# Patient Record
Sex: Female | Born: 1940 | Race: White | Hispanic: No | Marital: Married | Smoking: Never smoker
Health system: Southern US, Community
[De-identification: ages and names within clinical notes are randomized; demographics above are authoritative.]

## PROBLEM LIST (undated history)

## (undated) DIAGNOSIS — C50919 Malignant neoplasm of unspecified site of unspecified female breast: Secondary | ICD-10-CM

## (undated) DIAGNOSIS — E237 Disorder of pituitary gland, unspecified: Secondary | ICD-10-CM

## (undated) DIAGNOSIS — R42 Dizziness and giddiness: Secondary | ICD-10-CM

## (undated) DIAGNOSIS — S42023D Displaced fracture of shaft of unspecified clavicle, subsequent encounter for fracture with routine healing: Secondary | ICD-10-CM

## (undated) DIAGNOSIS — R52 Pain, unspecified: Secondary | ICD-10-CM

## (undated) DIAGNOSIS — S129XXA Fracture of neck, unspecified, initial encounter: Secondary | ICD-10-CM

## (undated) DIAGNOSIS — R609 Edema, unspecified: Secondary | ICD-10-CM

## (undated) DIAGNOSIS — Z9889 Other specified postprocedural states: Secondary | ICD-10-CM

## (undated) DIAGNOSIS — T4145XA Adverse effect of unspecified anesthetic, initial encounter: Secondary | ICD-10-CM

## (undated) DIAGNOSIS — I10 Essential (primary) hypertension: Secondary | ICD-10-CM

## (undated) DIAGNOSIS — T8859XA Other complications of anesthesia, initial encounter: Secondary | ICD-10-CM

## (undated) DIAGNOSIS — M1990 Unspecified osteoarthritis, unspecified site: Secondary | ICD-10-CM

## (undated) DIAGNOSIS — N3090 Cystitis, unspecified without hematuria: Secondary | ICD-10-CM

## (undated) DIAGNOSIS — R112 Nausea with vomiting, unspecified: Secondary | ICD-10-CM

## (undated) HISTORY — PX: COLONOSCOPY: SHX174

## (undated) HISTORY — PX: EYE SURGERY: SHX253

## (undated) HISTORY — DX: Cystitis, unspecified without hematuria: N30.90

## (undated) HISTORY — DX: Essential (primary) hypertension: I10

---
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
## 1981-08-19 HISTORY — PX: BREAST EXCISIONAL BIOPSY: SUR124

## 1994-08-19 HISTORY — PX: UPPER GI ENDOSCOPY: SHX6162

## 1994-08-19 HISTORY — PX: CHOLECYSTECTOMY: SHX55

## 2000-08-19 DIAGNOSIS — C50919 Malignant neoplasm of unspecified site of unspecified female breast: Secondary | ICD-10-CM

## 2000-08-19 HISTORY — DX: Malignant neoplasm of unspecified site of unspecified female breast: C50.919

## 2000-08-19 HISTORY — PX: MASTECTOMY: SHX3

## 2000-08-19 HISTORY — PX: BREAST SURGERY: SHX581

## 2004-08-29 ENCOUNTER — Ambulatory Visit: Payer: Self-pay | Admitting: General Surgery

## 2005-02-04 ENCOUNTER — Ambulatory Visit: Payer: Self-pay | Admitting: General Surgery

## 2006-02-17 ENCOUNTER — Ambulatory Visit: Payer: Self-pay | Admitting: General Surgery

## 2007-01-15 ENCOUNTER — Ambulatory Visit: Payer: Self-pay | Admitting: General Surgery

## 2007-01-15 LAB — HM COLONOSCOPY

## 2007-02-03 ENCOUNTER — Ambulatory Visit: Payer: Self-pay | Admitting: General Surgery

## 2007-02-06 ENCOUNTER — Ambulatory Visit: Payer: Self-pay | Admitting: General Surgery

## 2007-02-25 ENCOUNTER — Ambulatory Visit: Payer: Self-pay | Admitting: General Surgery

## 2008-03-03 ENCOUNTER — Ambulatory Visit: Payer: Self-pay | Admitting: General Surgery

## 2008-04-02 ENCOUNTER — Emergency Department: Payer: Self-pay | Admitting: Emergency Medicine

## 2008-04-06 ENCOUNTER — Emergency Department: Payer: Self-pay | Admitting: Emergency Medicine

## 2009-03-07 ENCOUNTER — Ambulatory Visit: Payer: Self-pay | Admitting: General Surgery

## 2009-10-17 ENCOUNTER — Emergency Department: Payer: Self-pay | Admitting: Emergency Medicine

## 2009-10-17 DIAGNOSIS — X58XXXA Exposure to other specified factors, initial encounter: Secondary | ICD-10-CM | POA: Insufficient documentation

## 2009-12-20 DIAGNOSIS — E559 Vitamin D deficiency, unspecified: Secondary | ICD-10-CM | POA: Insufficient documentation

## 2010-02-20 ENCOUNTER — Ambulatory Visit: Payer: Self-pay | Admitting: Unknown Physician Specialty

## 2010-03-08 ENCOUNTER — Ambulatory Visit: Payer: Self-pay | Admitting: Unknown Physician Specialty

## 2011-03-13 ENCOUNTER — Ambulatory Visit: Payer: Self-pay | Admitting: Unknown Physician Specialty

## 2012-04-06 ENCOUNTER — Ambulatory Visit: Payer: Self-pay | Admitting: Unknown Physician Specialty

## 2013-03-04 ENCOUNTER — Encounter: Payer: Self-pay | Admitting: *Deleted

## 2013-03-04 DIAGNOSIS — C801 Malignant (primary) neoplasm, unspecified: Secondary | ICD-10-CM | POA: Insufficient documentation

## 2013-05-13 ENCOUNTER — Ambulatory Visit: Payer: Self-pay | Admitting: Unknown Physician Specialty

## 2014-06-06 LAB — LIPID PANEL
Cholesterol: 218 mg/dL — AB (ref 0–200)
HDL: 46 mg/dL (ref 35–70)
LDL Cholesterol: 142 mg/dL
Triglycerides: 149 mg/dL (ref 40–160)

## 2014-10-20 ENCOUNTER — Ambulatory Visit: Payer: Self-pay | Admitting: Unknown Physician Specialty

## 2014-11-24 LAB — CBC AND DIFFERENTIAL
NEUTROS ABS: 7 /uL
WBC: 8.9 10*3/mL

## 2014-11-24 LAB — TSH: TSH: 4.53 u[IU]/mL (ref 0.41–5.90)

## 2014-11-24 LAB — BASIC METABOLIC PANEL
BUN: 7 mg/dL (ref 4–21)
CREATININE: 0.7 mg/dL (ref 0.5–1.1)
Glucose: 101 mg/dL
Sodium: 135 mmol/L — AB (ref 137–147)

## 2014-12-22 DIAGNOSIS — I10 Essential (primary) hypertension: Secondary | ICD-10-CM | POA: Insufficient documentation

## 2015-02-03 ENCOUNTER — Other Ambulatory Visit: Payer: Self-pay | Admitting: Physician Assistant

## 2015-02-03 DIAGNOSIS — I10 Essential (primary) hypertension: Secondary | ICD-10-CM

## 2015-02-03 MED ORDER — AMLODIPINE BESYLATE 5 MG PO TABS: 5.0000 mg | ORAL_TABLET | Freq: Every day | ORAL | Status: DC

## 2015-02-03 NOTE — Telephone Encounter (Signed)
[facility]  Thanks! -JB

## 2015-02-03 NOTE — Telephone Encounter (Signed)
Pt would like Amlodipine 5 mg sent to [facility] [address] Abbott Pao started this medication in April 2016 when she saw [place]. [facility] stated [facility] and she only has 2 pills left. Thanks TNP

## 2015-02-03 NOTE — Telephone Encounter (Signed)
Patient advised RX has been sent to pharmacy  

## 2015-03-03 DIAGNOSIS — H2513 Age-related nuclear cataract, bilateral: Secondary | ICD-10-CM | POA: Diagnosis not present

## 2015-04-08 DIAGNOSIS — L298 Other pruritus: Secondary | ICD-10-CM | POA: Diagnosis not present

## 2015-04-12 DIAGNOSIS — L508 Other urticaria: Secondary | ICD-10-CM | POA: Diagnosis not present

## 2015-04-13 DIAGNOSIS — L508 Other urticaria: Secondary | ICD-10-CM | POA: Diagnosis not present

## 2015-04-17 ENCOUNTER — Other Ambulatory Visit: Payer: Self-pay

## 2015-04-17 ENCOUNTER — Emergency Department
Admission: EM | Admit: 2015-04-17 | Discharge: 2015-04-17 | Disposition: A | Discharge status: Other Acute Inpatient Hospital | Payer: Medicare PPO | Attending: Emergency Medicine | Admitting: Emergency Medicine

## 2015-04-17 ENCOUNTER — Emergency Department: Payer: Medicare PPO

## 2015-04-17 DIAGNOSIS — S12500A Unspecified displaced fracture of sixth cervical vertebra, initial encounter for closed fracture: Secondary | ICD-10-CM | POA: Diagnosis not present

## 2015-04-17 DIAGNOSIS — S12101A Unspecified nondisplaced fracture of second cervical vertebra, initial encounter for closed fracture: Secondary | ICD-10-CM | POA: Diagnosis not present

## 2015-04-17 DIAGNOSIS — S3993XA Unspecified injury of pelvis, initial encounter: Secondary | ICD-10-CM | POA: Diagnosis not present

## 2015-04-17 DIAGNOSIS — S2220XA Unspecified fracture of sternum, initial encounter for closed fracture: Secondary | ICD-10-CM | POA: Diagnosis not present

## 2015-04-17 DIAGNOSIS — I5189 Other ill-defined heart diseases: Secondary | ICD-10-CM | POA: Diagnosis not present

## 2015-04-17 DIAGNOSIS — R079 Chest pain, unspecified: Secondary | ICD-10-CM | POA: Diagnosis not present

## 2015-04-17 DIAGNOSIS — Z041 Encounter for examination and observation following transport accident: Secondary | ICD-10-CM | POA: Diagnosis not present

## 2015-04-17 DIAGNOSIS — Y998 Other external cause status: Secondary | ICD-10-CM | POA: Diagnosis not present

## 2015-04-17 DIAGNOSIS — M2578 Osteophyte, vertebrae: Secondary | ICD-10-CM | POA: Diagnosis not present

## 2015-04-17 DIAGNOSIS — S2241XA Multiple fractures of ribs, right side, initial encounter for closed fracture: Secondary | ICD-10-CM | POA: Insufficient documentation

## 2015-04-17 DIAGNOSIS — S0990XA Unspecified injury of head, initial encounter: Secondary | ICD-10-CM | POA: Insufficient documentation

## 2015-04-17 DIAGNOSIS — S27892A Contusion of other specified intrathoracic organs, initial encounter: Secondary | ICD-10-CM | POA: Diagnosis not present

## 2015-04-17 DIAGNOSIS — M19011 Primary osteoarthritis, right shoulder: Secondary | ICD-10-CM | POA: Diagnosis not present

## 2015-04-17 DIAGNOSIS — S2231XA Fracture of one rib, right side, initial encounter for closed fracture: Secondary | ICD-10-CM

## 2015-04-17 DIAGNOSIS — I10 Essential (primary) hypertension: Secondary | ICD-10-CM | POA: Diagnosis not present

## 2015-04-17 DIAGNOSIS — S12100A Unspecified displaced fracture of second cervical vertebra, initial encounter for closed fracture: Secondary | ICD-10-CM | POA: Insufficient documentation

## 2015-04-17 DIAGNOSIS — Y9389 Activity, other specified: Secondary | ICD-10-CM | POA: Insufficient documentation

## 2015-04-17 DIAGNOSIS — M25519 Pain in unspecified shoulder: Secondary | ICD-10-CM | POA: Diagnosis not present

## 2015-04-17 DIAGNOSIS — M5032 Other cervical disc degeneration, mid-cervical region: Secondary | ICD-10-CM | POA: Diagnosis not present

## 2015-04-17 DIAGNOSIS — I351 Nonrheumatic aortic (valve) insufficiency: Secondary | ICD-10-CM | POA: Diagnosis not present

## 2015-04-17 DIAGNOSIS — J9811 Atelectasis: Secondary | ICD-10-CM | POA: Diagnosis not present

## 2015-04-17 DIAGNOSIS — S42001A Fracture of unspecified part of right clavicle, initial encounter for closed fracture: Secondary | ICD-10-CM | POA: Diagnosis not present

## 2015-04-17 DIAGNOSIS — S2249XA Multiple fractures of ribs, unspecified side, initial encounter for closed fracture: Secondary | ICD-10-CM | POA: Diagnosis not present

## 2015-04-17 DIAGNOSIS — M5136 Other intervertebral disc degeneration, lumbar region: Secondary | ICD-10-CM | POA: Diagnosis not present

## 2015-04-17 DIAGNOSIS — S129XXA Fracture of neck, unspecified, initial encounter: Secondary | ICD-10-CM | POA: Diagnosis not present

## 2015-04-17 DIAGNOSIS — S12190A Other displaced fracture of second cervical vertebra, initial encounter for closed fracture: Secondary | ICD-10-CM | POA: Diagnosis not present

## 2015-04-17 DIAGNOSIS — M47816 Spondylosis without myelopathy or radiculopathy, lumbar region: Secondary | ICD-10-CM | POA: Diagnosis not present

## 2015-04-17 DIAGNOSIS — I4949 Other premature depolarization: Secondary | ICD-10-CM | POA: Diagnosis not present

## 2015-04-17 DIAGNOSIS — S3981XA Other specified injuries of abdomen, initial encounter: Secondary | ICD-10-CM | POA: Diagnosis not present

## 2015-04-17 DIAGNOSIS — S42021A Displaced fracture of shaft of right clavicle, initial encounter for closed fracture: Secondary | ICD-10-CM | POA: Diagnosis not present

## 2015-04-17 DIAGNOSIS — S3992XA Unspecified injury of lower back, initial encounter: Secondary | ICD-10-CM | POA: Diagnosis not present

## 2015-04-17 DIAGNOSIS — S2222XA Fracture of body of sternum, initial encounter for closed fracture: Secondary | ICD-10-CM | POA: Diagnosis not present

## 2015-04-17 DIAGNOSIS — G8911 Acute pain due to trauma: Secondary | ICD-10-CM | POA: Diagnosis not present

## 2015-04-17 DIAGNOSIS — S20211A Contusion of right front wall of thorax, initial encounter: Secondary | ICD-10-CM | POA: Insufficient documentation

## 2015-04-17 DIAGNOSIS — M4856XA Collapsed vertebra, not elsewhere classified, lumbar region, initial encounter for fracture: Secondary | ICD-10-CM | POA: Diagnosis not present

## 2015-04-17 DIAGNOSIS — J45909 Unspecified asthma, uncomplicated: Secondary | ICD-10-CM | POA: Diagnosis not present

## 2015-04-17 DIAGNOSIS — Y92410 Unspecified street and highway as the place of occurrence of the external cause: Secondary | ICD-10-CM | POA: Diagnosis not present

## 2015-04-17 DIAGNOSIS — S199XXA Unspecified injury of neck, initial encounter: Secondary | ICD-10-CM | POA: Diagnosis present

## 2015-04-17 LAB — CBC WITH DIFFERENTIAL/PLATELET
Basophils Absolute: 0.2 10*3/uL — ABNORMAL HIGH (ref 0–0.1)
Basophils Relative: 1 %
EOS PCT: 1 %
Eosinophils Absolute: 0.2 10*3/uL (ref 0–0.7)
HEMATOCRIT: 46.6 % (ref 35.0–47.0)
Hemoglobin: 15.7 g/dL (ref 12.0–16.0)
LYMPHS ABS: 3.1 10*3/uL (ref 1.0–3.6)
LYMPHS PCT: 19 %
MCH: 28.4 pg (ref 26.0–34.0)
MCHC: 33.6 g/dL (ref 32.0–36.0)
MCV: 84.4 fL (ref 80.0–100.0)
MONO ABS: 1 10*3/uL — AB (ref 0.2–0.9)
Monocytes Relative: 6 %
NEUTROS ABS: 11.6 10*3/uL — AB (ref 1.4–6.5)
Neutrophils Relative %: 73 %
PLATELETS: 358 10*3/uL (ref 150–440)
RBC: 5.52 MIL/uL — AB (ref 3.80–5.20)
RDW: 12.8 % (ref 11.5–14.5)
WBC: 16 10*3/uL — ABNORMAL HIGH (ref 3.6–11.0)

## 2015-04-17 LAB — COMPREHENSIVE METABOLIC PANEL
ALT: 16 U/L (ref 14–54)
AST: 40 U/L (ref 15–41)
Albumin: 4.7 g/dL (ref 3.5–5.0)
Alkaline Phosphatase: 62 U/L (ref 38–126)
Anion gap: 10 (ref 5–15)
BILIRUBIN TOTAL: 0.7 mg/dL (ref 0.3–1.2)
BUN: 8 mg/dL (ref 6–20)
CALCIUM: 9.7 mg/dL (ref 8.9–10.3)
CHLORIDE: 98 mmol/L — AB (ref 101–111)
CO2: 25 mmol/L (ref 22–32)
CREATININE: 0.75 mg/dL (ref 0.44–1.00)
Glucose, Bld: 123 mg/dL — ABNORMAL HIGH (ref 65–99)
Potassium: 3.1 mmol/L — ABNORMAL LOW (ref 3.5–5.1)
Sodium: 133 mmol/L — ABNORMAL LOW (ref 135–145)
TOTAL PROTEIN: 7.6 g/dL (ref 6.5–8.1)

## 2015-04-17 LAB — URINALYSIS COMPLETE WITH MICROSCOPIC (ARMC ONLY)
BILIRUBIN URINE: NEGATIVE
Bacteria, UA: NONE SEEN
GLUCOSE, UA: NEGATIVE mg/dL
Hgb urine dipstick: NEGATIVE
Leukocytes, UA: NEGATIVE
NITRITE: NEGATIVE
Protein, ur: NEGATIVE mg/dL
Specific Gravity, Urine: 1.009 (ref 1.005–1.030)
pH: 7 (ref 5.0–8.0)

## 2015-04-17 MED ORDER — ONDANSETRON HCL 4 MG/2ML IJ SOLN
4.0000 mg | Freq: Once | INTRAMUSCULAR | Status: AC
  Administered 2015-04-17: 4 mg via INTRAVENOUS
  Filled 2015-04-17: qty 2

## 2015-04-17 MED ORDER — MORPHINE SULFATE (PF) 4 MG/ML IV SOLN
4.0000 mg | Freq: Once | INTRAVENOUS | Status: AC
  Administered 2015-04-17: 4 mg via INTRAVENOUS
  Filled 2015-04-17: qty 1

## 2015-04-17 MED ORDER — HYDROMORPHONE HCL 1 MG/ML IJ SOLN
0.5000 mg | Freq: Once | INTRAMUSCULAR | Status: AC
  Administered 2015-04-17: 0.5 mg via INTRAVENOUS
  Filled 2015-04-17: qty 1
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
## 2015-04-17 NOTE — ED Notes (Signed)
Patient able to void using bedpan. Urine sent to lab. Patient to CT.
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
## 2015-04-21 DIAGNOSIS — Z9181 History of falling: Secondary | ICD-10-CM | POA: Diagnosis not present

## 2015-04-21 DIAGNOSIS — Z853 Personal history of malignant neoplasm of breast: Secondary | ICD-10-CM | POA: Diagnosis not present

## 2015-04-21 DIAGNOSIS — I10 Essential (primary) hypertension: Secondary | ICD-10-CM | POA: Diagnosis not present

## 2015-04-21 DIAGNOSIS — J45909 Unspecified asthma, uncomplicated: Secondary | ICD-10-CM | POA: Diagnosis not present

## 2015-04-21 DIAGNOSIS — S2241XD Multiple fractures of ribs, right side, subsequent encounter for fracture with routine healing: Secondary | ICD-10-CM | POA: Diagnosis not present

## 2015-04-21 DIAGNOSIS — S2220XD Unspecified fracture of sternum, subsequent encounter for fracture with routine healing: Secondary | ICD-10-CM | POA: Diagnosis not present

## 2015-04-21 DIAGNOSIS — S12100D Unspecified displaced fracture of second cervical vertebra, subsequent encounter for fracture with routine healing: Secondary | ICD-10-CM | POA: Diagnosis not present

## 2015-04-25 DIAGNOSIS — Z853 Personal history of malignant neoplasm of breast: Secondary | ICD-10-CM | POA: Diagnosis not present

## 2015-04-25 DIAGNOSIS — S12100D Unspecified displaced fracture of second cervical vertebra, subsequent encounter for fracture with routine healing: Secondary | ICD-10-CM | POA: Diagnosis not present

## 2015-04-25 DIAGNOSIS — S2220XD Unspecified fracture of sternum, subsequent encounter for fracture with routine healing: Secondary | ICD-10-CM | POA: Diagnosis not present

## 2015-04-25 DIAGNOSIS — J45909 Unspecified asthma, uncomplicated: Secondary | ICD-10-CM | POA: Diagnosis not present

## 2015-04-25 DIAGNOSIS — S2241XD Multiple fractures of ribs, right side, subsequent encounter for fracture with routine healing: Secondary | ICD-10-CM | POA: Diagnosis not present

## 2015-04-25 DIAGNOSIS — Z9181 History of falling: Secondary | ICD-10-CM | POA: Diagnosis not present

## 2015-04-25 DIAGNOSIS — I10 Essential (primary) hypertension: Secondary | ICD-10-CM | POA: Diagnosis not present

## 2015-04-26 DIAGNOSIS — S2220XD Unspecified fracture of sternum, subsequent encounter for fracture with routine healing: Secondary | ICD-10-CM | POA: Diagnosis not present

## 2015-04-26 DIAGNOSIS — I10 Essential (primary) hypertension: Secondary | ICD-10-CM | POA: Diagnosis not present

## 2015-04-26 DIAGNOSIS — J45909 Unspecified asthma, uncomplicated: Secondary | ICD-10-CM | POA: Diagnosis not present

## 2015-04-26 DIAGNOSIS — S12100D Unspecified displaced fracture of second cervical vertebra, subsequent encounter for fracture with routine healing: Secondary | ICD-10-CM | POA: Diagnosis not present

## 2015-04-26 DIAGNOSIS — Z9181 History of falling: Secondary | ICD-10-CM | POA: Diagnosis not present

## 2015-04-26 DIAGNOSIS — Z853 Personal history of malignant neoplasm of breast: Secondary | ICD-10-CM | POA: Diagnosis not present

## 2015-04-26 DIAGNOSIS — S2241XD Multiple fractures of ribs, right side, subsequent encounter for fracture with routine healing: Secondary | ICD-10-CM | POA: Diagnosis not present

## 2015-04-27 DIAGNOSIS — S2241XD Multiple fractures of ribs, right side, subsequent encounter for fracture with routine healing: Secondary | ICD-10-CM | POA: Diagnosis not present

## 2015-04-27 DIAGNOSIS — S2220XD Unspecified fracture of sternum, subsequent encounter for fracture with routine healing: Secondary | ICD-10-CM | POA: Diagnosis not present

## 2015-04-27 DIAGNOSIS — S12100D Unspecified displaced fracture of second cervical vertebra, subsequent encounter for fracture with routine healing: Secondary | ICD-10-CM | POA: Diagnosis not present

## 2015-04-27 DIAGNOSIS — J45909 Unspecified asthma, uncomplicated: Secondary | ICD-10-CM | POA: Diagnosis not present

## 2015-04-27 DIAGNOSIS — I10 Essential (primary) hypertension: Secondary | ICD-10-CM | POA: Diagnosis not present

## 2015-04-27 DIAGNOSIS — Z9181 History of falling: Secondary | ICD-10-CM | POA: Diagnosis not present

## 2015-04-27 DIAGNOSIS — Z853 Personal history of malignant neoplasm of breast: Secondary | ICD-10-CM | POA: Diagnosis not present

## 2015-05-02 DIAGNOSIS — Z9181 History of falling: Secondary | ICD-10-CM | POA: Diagnosis not present

## 2015-05-02 DIAGNOSIS — J45909 Unspecified asthma, uncomplicated: Secondary | ICD-10-CM | POA: Diagnosis not present

## 2015-05-02 DIAGNOSIS — Z853 Personal history of malignant neoplasm of breast: Secondary | ICD-10-CM | POA: Diagnosis not present

## 2015-05-02 DIAGNOSIS — S12100D Unspecified displaced fracture of second cervical vertebra, subsequent encounter for fracture with routine healing: Secondary | ICD-10-CM | POA: Diagnosis not present

## 2015-05-02 DIAGNOSIS — S2220XD Unspecified fracture of sternum, subsequent encounter for fracture with routine healing: Secondary | ICD-10-CM | POA: Diagnosis not present

## 2015-05-02 DIAGNOSIS — S2241XD Multiple fractures of ribs, right side, subsequent encounter for fracture with routine healing: Secondary | ICD-10-CM | POA: Diagnosis not present

## 2015-05-02 DIAGNOSIS — I10 Essential (primary) hypertension: Secondary | ICD-10-CM | POA: Diagnosis not present

## 2015-05-04 DIAGNOSIS — S42021D Displaced fracture of shaft of right clavicle, subsequent encounter for fracture with routine healing: Secondary | ICD-10-CM | POA: Diagnosis not present

## 2015-05-04 DIAGNOSIS — M898X1 Other specified disorders of bone, shoulder: Secondary | ICD-10-CM | POA: Diagnosis not present

## 2015-05-04 DIAGNOSIS — S43004A Unspecified dislocation of right shoulder joint, initial encounter: Secondary | ICD-10-CM | POA: Diagnosis not present

## 2015-05-05 DIAGNOSIS — Z853 Personal history of malignant neoplasm of breast: Secondary | ICD-10-CM | POA: Diagnosis not present

## 2015-05-05 DIAGNOSIS — S2241XD Multiple fractures of ribs, right side, subsequent encounter for fracture with routine healing: Secondary | ICD-10-CM | POA: Diagnosis not present

## 2015-05-05 DIAGNOSIS — I10 Essential (primary) hypertension: Secondary | ICD-10-CM | POA: Diagnosis not present

## 2015-05-05 DIAGNOSIS — Z9181 History of falling: Secondary | ICD-10-CM | POA: Diagnosis not present

## 2015-05-05 DIAGNOSIS — J45909 Unspecified asthma, uncomplicated: Secondary | ICD-10-CM | POA: Diagnosis not present

## 2015-05-05 DIAGNOSIS — S2220XD Unspecified fracture of sternum, subsequent encounter for fracture with routine healing: Secondary | ICD-10-CM | POA: Diagnosis not present

## 2015-05-05 DIAGNOSIS — S12100D Unspecified displaced fracture of second cervical vertebra, subsequent encounter for fracture with routine healing: Secondary | ICD-10-CM | POA: Diagnosis not present

## 2015-05-09 ENCOUNTER — Ambulatory Visit (INDEPENDENT_AMBULATORY_CARE_PROVIDER_SITE_OTHER): Payer: Medicare PPO | Admitting: Family Medicine

## 2015-05-09 ENCOUNTER — Encounter: Payer: Self-pay | Admitting: Family Medicine

## 2015-05-09 VITALS — BP 130/70 | HR 80 | Temp 98.7°F | Resp 16 | Wt 145.0 lb

## 2015-05-09 DIAGNOSIS — G4700 Insomnia, unspecified: Secondary | ICD-10-CM | POA: Diagnosis not present

## 2015-05-09 DIAGNOSIS — S129XXA Fracture of neck, unspecified, initial encounter: Secondary | ICD-10-CM

## 2015-05-09 DIAGNOSIS — S42009A Fracture of unspecified part of unspecified clavicle, initial encounter for closed fracture: Secondary | ICD-10-CM | POA: Diagnosis not present

## 2015-05-09 HISTORY — DX: Fracture of neck, unspecified, initial encounter: S12.9XXA

## 2015-05-09 MED ORDER — ALPRAZOLAM 0.25 MG PO TABS: 0.2500 mg | ORAL_TABLET | Freq: Four times a day (QID) | ORAL | Status: DC | PRN
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
## 2015-05-11 DIAGNOSIS — J45909 Unspecified asthma, uncomplicated: Secondary | ICD-10-CM | POA: Diagnosis not present

## 2015-05-11 DIAGNOSIS — S2241XD Multiple fractures of ribs, right side, subsequent encounter for fracture with routine healing: Secondary | ICD-10-CM | POA: Diagnosis not present

## 2015-05-11 DIAGNOSIS — S12100D Unspecified displaced fracture of second cervical vertebra, subsequent encounter for fracture with routine healing: Secondary | ICD-10-CM | POA: Diagnosis not present

## 2015-05-11 DIAGNOSIS — I10 Essential (primary) hypertension: Secondary | ICD-10-CM | POA: Diagnosis not present

## 2015-05-11 DIAGNOSIS — Z9181 History of falling: Secondary | ICD-10-CM | POA: Diagnosis not present

## 2015-05-11 DIAGNOSIS — S2220XD Unspecified fracture of sternum, subsequent encounter for fracture with routine healing: Secondary | ICD-10-CM | POA: Diagnosis not present

## 2015-05-11 DIAGNOSIS — Z853 Personal history of malignant neoplasm of breast: Secondary | ICD-10-CM | POA: Diagnosis not present

## 2015-05-13 ENCOUNTER — Emergency Department: Payer: Medicare PPO

## 2015-05-13 ENCOUNTER — Emergency Department
Admission: EM | Admit: 2015-05-13 | Discharge: 2015-05-13 | Disposition: A | Discharge status: Home / Self Care | Payer: Medicare PPO | Attending: Student | Admitting: Student

## 2015-05-13 DIAGNOSIS — M542 Cervicalgia: Secondary | ICD-10-CM | POA: Insufficient documentation

## 2015-05-13 DIAGNOSIS — R42 Dizziness and giddiness: Secondary | ICD-10-CM | POA: Diagnosis not present

## 2015-05-13 DIAGNOSIS — Z79899 Other long term (current) drug therapy: Secondary | ICD-10-CM | POA: Diagnosis not present

## 2015-05-13 DIAGNOSIS — R079 Chest pain, unspecified: Secondary | ICD-10-CM | POA: Insufficient documentation

## 2015-05-13 DIAGNOSIS — I10 Essential (primary) hypertension: Secondary | ICD-10-CM | POA: Diagnosis not present

## 2015-05-13 DIAGNOSIS — G8921 Chronic pain due to trauma: Secondary | ICD-10-CM | POA: Diagnosis not present

## 2015-05-13 DIAGNOSIS — Z7952 Long term (current) use of systemic steroids: Secondary | ICD-10-CM | POA: Insufficient documentation

## 2015-05-13 DIAGNOSIS — M549 Dorsalgia, unspecified: Secondary | ICD-10-CM | POA: Insufficient documentation

## 2015-05-13 DIAGNOSIS — H8110 Benign paroxysmal vertigo, unspecified ear: Secondary | ICD-10-CM | POA: Diagnosis not present

## 2015-05-13 DIAGNOSIS — R531 Weakness: Secondary | ICD-10-CM | POA: Diagnosis not present

## 2015-05-13 LAB — URINALYSIS COMPLETE WITH MICROSCOPIC (ARMC ONLY)
Bilirubin Urine: NEGATIVE
Glucose, UA: NEGATIVE mg/dL
Hgb urine dipstick: NEGATIVE
Ketones, ur: NEGATIVE mg/dL
Leukocytes, UA: NEGATIVE
Nitrite: NEGATIVE
PH: 7 (ref 5.0–8.0)
PROTEIN: NEGATIVE mg/dL
Specific Gravity, Urine: 1.005 (ref 1.005–1.030)

## 2015-05-13 LAB — CBC WITH DIFFERENTIAL/PLATELET
BASOS ABS: 0.1 10*3/uL (ref 0–0.1)
Basophils Relative: 1 %
EOS PCT: 1 %
Eosinophils Absolute: 0.1 10*3/uL (ref 0–0.7)
HEMATOCRIT: 42 % (ref 35.0–47.0)
HEMOGLOBIN: 14.7 g/dL (ref 12.0–16.0)
LYMPHS ABS: 0.9 10*3/uL — AB (ref 1.0–3.6)
LYMPHS PCT: 11 %
MCH: 29.3 pg (ref 26.0–34.0)
MCHC: 35.1 g/dL (ref 32.0–36.0)
MCV: 83.5 fL (ref 80.0–100.0)
Monocytes Absolute: 0.7 10*3/uL (ref 0.2–0.9)
Monocytes Relative: 9 %
Neutro Abs: 6.6 10*3/uL — ABNORMAL HIGH (ref 1.4–6.5)
Neutrophils Relative %: 78 %
PLATELETS: 319 10*3/uL (ref 150–440)
RBC: 5.03 MIL/uL (ref 3.80–5.20)
RDW: 12.5 % (ref 11.5–14.5)
WBC: 8.4 10*3/uL (ref 3.6–11.0)

## 2015-05-13 LAB — COMPREHENSIVE METABOLIC PANEL
ALK PHOS: 166 U/L — AB (ref 38–126)
ALT: 14 U/L (ref 14–54)
AST: 27 U/L (ref 15–41)
Albumin: 4.5 g/dL (ref 3.5–5.0)
Anion gap: 11 (ref 5–15)
BUN: 8 mg/dL (ref 6–20)
CALCIUM: 9.5 mg/dL (ref 8.9–10.3)
CHLORIDE: 94 mmol/L — AB (ref 101–111)
CO2: 25 mmol/L (ref 22–32)
CREATININE: 0.57 mg/dL (ref 0.44–1.00)
GFR calc Af Amer: >60 mL/min (ref >60)
Glucose, Bld: 110 mg/dL — ABNORMAL HIGH (ref 65–99)
Potassium: 3.6 mmol/L (ref 3.5–5.1)
Sodium: 130 mmol/L — ABNORMAL LOW (ref 135–145)
Total Bilirubin: 0.3 mg/dL (ref 0.3–1.2)
Total Protein: 7.3 g/dL (ref 6.5–8.1)

## 2015-05-13 LAB — TROPONIN I

## 2015-05-13 MED ORDER — SODIUM CHLORIDE 0.9 % IV BOLUS (SEPSIS)
1000.0000 mL | Freq: Once | INTRAVENOUS | Status: AC
  Administered 2015-05-13: 1000 mL via INTRAVENOUS
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
## 2015-05-13 NOTE — ED Notes (Signed)
Iv and urine cather from august d/c'd from computer
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
## 2015-05-19 DIAGNOSIS — M5021 Other cervical disc displacement,  high cervical region: Secondary | ICD-10-CM | POA: Diagnosis not present

## 2015-05-19 DIAGNOSIS — Z6822 Body mass index (BMI) 22.0-22.9, adult: Secondary | ICD-10-CM | POA: Diagnosis not present

## 2015-05-19 DIAGNOSIS — S12101A Unspecified nondisplaced fracture of second cervical vertebra, initial encounter for closed fracture: Secondary | ICD-10-CM | POA: Diagnosis not present

## 2015-05-19 DIAGNOSIS — Z888 Allergy status to other drugs, medicaments and biological substances status: Secondary | ICD-10-CM | POA: Diagnosis not present

## 2015-05-19 DIAGNOSIS — Z885 Allergy status to narcotic agent status: Secondary | ICD-10-CM | POA: Diagnosis not present

## 2015-05-19 DIAGNOSIS — S12100D Unspecified displaced fracture of second cervical vertebra, subsequent encounter for fracture with routine healing: Secondary | ICD-10-CM | POA: Diagnosis not present

## 2015-05-19 DIAGNOSIS — Z91018 Allergy to other foods: Secondary | ICD-10-CM | POA: Diagnosis not present

## 2015-05-19 DIAGNOSIS — M542 Cervicalgia: Secondary | ICD-10-CM | POA: Diagnosis not present

## 2015-05-19 DIAGNOSIS — S32039D Unspecified fracture of third lumbar vertebra, subsequent encounter for fracture with routine healing: Secondary | ICD-10-CM | POA: Diagnosis not present

## 2015-05-19 DIAGNOSIS — S42001A Fracture of unspecified part of right clavicle, initial encounter for closed fracture: Secondary | ICD-10-CM | POA: Diagnosis not present

## 2015-05-19 DIAGNOSIS — Z882 Allergy status to sulfonamides status: Secondary | ICD-10-CM | POA: Diagnosis not present

## 2015-06-01 DIAGNOSIS — S42023D Displaced fracture of shaft of unspecified clavicle, subsequent encounter for fracture with routine healing: Secondary | ICD-10-CM | POA: Insufficient documentation

## 2015-06-01 DIAGNOSIS — M898X1 Other specified disorders of bone, shoulder: Secondary | ICD-10-CM | POA: Diagnosis not present

## 2015-06-01 DIAGNOSIS — S42021D Displaced fracture of shaft of right clavicle, subsequent encounter for fracture with routine healing: Secondary | ICD-10-CM | POA: Diagnosis not present

## 2015-06-01 HISTORY — DX: Displaced fracture of shaft of unspecified clavicle, subsequent encounter for fracture with routine healing: S42.023D

## 2015-06-07 ENCOUNTER — Other Ambulatory Visit: Payer: Self-pay | Admitting: Family Medicine

## 2015-06-09 ENCOUNTER — Other Ambulatory Visit: Payer: Self-pay | Admitting: Physician Assistant

## 2015-06-09 MED ORDER — METOPROLOL SUCCINATE ER 100 MG PO TB24: 100.0000 mg | ORAL_TABLET | Freq: Every day | ORAL | Status: DC

## 2015-06-12 ENCOUNTER — Other Ambulatory Visit: Payer: Self-pay | Admitting: Family Medicine

## 2015-06-12 NOTE — Telephone Encounter (Signed)
Pt called saying the pharmacy was trying to get [place] for a refill on her metoprolol succinate (TOPROL-XL) 100 MG 24 hr tablet but has been unable.   [facility] [address]  Hoyt Koch

## 2015-06-13 NOTE — Telephone Encounter (Signed)
Rx has already been sent

## 2015-06-20 DIAGNOSIS — S12100A Unspecified displaced fracture of second cervical vertebra, initial encounter for closed fracture: Secondary | ICD-10-CM | POA: Diagnosis not present

## 2015-06-20 DIAGNOSIS — S12100D Unspecified displaced fracture of second cervical vertebra, subsequent encounter for fracture with routine healing: Secondary | ICD-10-CM | POA: Diagnosis not present

## 2015-06-20 DIAGNOSIS — Z6822 Body mass index (BMI) 22.0-22.9, adult: Secondary | ICD-10-CM | POA: Diagnosis not present

## 2015-06-20 DIAGNOSIS — M4310 Spondylolisthesis, site unspecified: Secondary | ICD-10-CM | POA: Diagnosis not present

## 2015-06-20 DIAGNOSIS — M50322 Other cervical disc degeneration at C5-C6 level: Secondary | ICD-10-CM | POA: Diagnosis not present

## 2015-06-20 DIAGNOSIS — M5010 Cervical disc disorder with radiculopathy, unspecified cervical region: Secondary | ICD-10-CM | POA: Diagnosis not present

## 2015-06-20 DIAGNOSIS — M50323 Other cervical disc degeneration at C6-C7 level: Secondary | ICD-10-CM | POA: Diagnosis not present

## 2015-07-01 ENCOUNTER — Ambulatory Visit (INDEPENDENT_AMBULATORY_CARE_PROVIDER_SITE_OTHER): Payer: Medicare PPO

## 2015-07-01 DIAGNOSIS — Z23 Encounter for immunization: Secondary | ICD-10-CM

## 2015-07-10 DIAGNOSIS — S42001A Fracture of unspecified part of right clavicle, initial encounter for closed fracture: Secondary | ICD-10-CM | POA: Diagnosis not present

## 2015-07-10 DIAGNOSIS — S42001D Fracture of unspecified part of right clavicle, subsequent encounter for fracture with routine healing: Secondary | ICD-10-CM | POA: Diagnosis not present

## 2015-07-10 DIAGNOSIS — T148 Other injury of unspecified body region: Secondary | ICD-10-CM | POA: Diagnosis not present

## 2015-07-10 DIAGNOSIS — S42021D Displaced fracture of shaft of right clavicle, subsequent encounter for fracture with routine healing: Secondary | ICD-10-CM | POA: Diagnosis not present

## 2015-07-19 DIAGNOSIS — M5137 Other intervertebral disc degeneration, lumbosacral region: Secondary | ICD-10-CM | POA: Diagnosis not present

## 2015-07-19 DIAGNOSIS — Z888 Allergy status to other drugs, medicaments and biological substances status: Secondary | ICD-10-CM | POA: Diagnosis not present

## 2015-07-19 DIAGNOSIS — S22059D Unspecified fracture of T5-T6 vertebra, subsequent encounter for fracture with routine healing: Secondary | ICD-10-CM | POA: Diagnosis not present

## 2015-07-19 DIAGNOSIS — S22000S Wedge compression fracture of unspecified thoracic vertebra, sequela: Secondary | ICD-10-CM | POA: Diagnosis not present

## 2015-07-19 DIAGNOSIS — M4854XD Collapsed vertebra, not elsewhere classified, thoracic region, subsequent encounter for fracture with routine healing: Secondary | ICD-10-CM | POA: Diagnosis not present

## 2015-07-19 DIAGNOSIS — Z91041 Radiographic dye allergy status: Secondary | ICD-10-CM | POA: Diagnosis not present

## 2015-07-19 DIAGNOSIS — Z882 Allergy status to sulfonamides status: Secondary | ICD-10-CM | POA: Diagnosis not present

## 2015-07-19 DIAGNOSIS — S42001D Fracture of unspecified part of right clavicle, subsequent encounter for fracture with routine healing: Secondary | ICD-10-CM | POA: Diagnosis not present

## 2015-07-19 DIAGNOSIS — S12100D Unspecified displaced fracture of second cervical vertebra, subsequent encounter for fracture with routine healing: Secondary | ICD-10-CM | POA: Diagnosis not present

## 2015-07-19 DIAGNOSIS — M50323 Other cervical disc degeneration at C6-C7 level: Secondary | ICD-10-CM | POA: Diagnosis not present

## 2015-07-19 DIAGNOSIS — Z6822 Body mass index (BMI) 22.0-22.9, adult: Secondary | ICD-10-CM | POA: Diagnosis not present

## 2015-07-19 DIAGNOSIS — S12101A Unspecified nondisplaced fracture of second cervical vertebra, initial encounter for closed fracture: Secondary | ICD-10-CM | POA: Diagnosis not present

## 2015-07-19 DIAGNOSIS — M4856XD Collapsed vertebra, not elsewhere classified, lumbar region, subsequent encounter for fracture with routine healing: Secondary | ICD-10-CM | POA: Diagnosis not present

## 2015-07-19 DIAGNOSIS — M5136 Other intervertebral disc degeneration, lumbar region: Secondary | ICD-10-CM | POA: Diagnosis not present

## 2015-07-19 DIAGNOSIS — S32039D Unspecified fracture of third lumbar vertebra, subsequent encounter for fracture with routine healing: Secondary | ICD-10-CM | POA: Diagnosis not present

## 2015-07-19 DIAGNOSIS — S32000S Wedge compression fracture of unspecified lumbar vertebra, sequela: Secondary | ICD-10-CM | POA: Diagnosis not present

## 2015-07-19 DIAGNOSIS — S42031D Displaced fracture of lateral end of right clavicle, subsequent encounter for fracture with routine healing: Secondary | ICD-10-CM | POA: Diagnosis not present

## 2015-07-19 DIAGNOSIS — R930 Abnormal findings on diagnostic imaging of skull and head, not elsewhere classified: Secondary | ICD-10-CM | POA: Diagnosis not present

## 2015-07-19 DIAGNOSIS — R269 Unspecified abnormalities of gait and mobility: Secondary | ICD-10-CM | POA: Diagnosis not present

## 2015-07-24 ENCOUNTER — Other Ambulatory Visit: Payer: Self-pay | Admitting: Family Medicine

## 2015-07-24 DIAGNOSIS — G4700 Insomnia, unspecified: Secondary | ICD-10-CM

## 2015-07-24 MED ORDER — ALPRAZOLAM 0.25 MG PO TABS: 0.2500 mg | ORAL_TABLET | Freq: Four times a day (QID) | ORAL | Status: DC | PRN

## 2015-07-24 NOTE — Telephone Encounter (Signed)
Pt contacted office for refill request on the following medications: ALPRAZolam (XANAX) 0.25 MG tablet to [facility] [address] Marina Gravel TNP

## 2015-07-24 NOTE — Telephone Encounter (Signed)
Please call in alprazolam.  

## 2015-07-25 NOTE — Telephone Encounter (Signed)
Rx called in to pharmacy. 

## 2015-08-01 DIAGNOSIS — M25511 Pain in right shoulder: Secondary | ICD-10-CM | POA: Diagnosis not present

## 2015-08-04 DIAGNOSIS — M25511 Pain in right shoulder: Secondary | ICD-10-CM | POA: Diagnosis not present

## 2015-08-07 DIAGNOSIS — M25511 Pain in right shoulder: Secondary | ICD-10-CM | POA: Diagnosis not present

## 2015-08-09 DIAGNOSIS — M25511 Pain in right shoulder: Secondary | ICD-10-CM | POA: Diagnosis not present

## 2015-08-10 ENCOUNTER — Encounter: Payer: Self-pay | Admitting: Family Medicine

## 2015-08-10 ENCOUNTER — Ambulatory Visit (INDEPENDENT_AMBULATORY_CARE_PROVIDER_SITE_OTHER): Payer: Medicare PPO | Admitting: Family Medicine

## 2015-08-10 VITALS — BP 120/84 | HR 98 | Temp 98.4°F | Resp 16 | Wt 138.2 lb

## 2015-08-10 DIAGNOSIS — I498 Other specified cardiac arrhythmias: Secondary | ICD-10-CM | POA: Insufficient documentation

## 2015-08-10 DIAGNOSIS — K7689 Other specified diseases of liver: Secondary | ICD-10-CM | POA: Insufficient documentation

## 2015-08-10 DIAGNOSIS — H6981 Other specified disorders of Eustachian tube, right ear: Secondary | ICD-10-CM

## 2015-08-10 DIAGNOSIS — R011 Cardiac murmur, unspecified: Secondary | ICD-10-CM

## 2015-08-10 DIAGNOSIS — F419 Anxiety disorder, unspecified: Secondary | ICD-10-CM | POA: Insufficient documentation

## 2015-08-10 NOTE — Patient Instructions (Addendum)
Discussed use of Claritin D or similar for your ear. If symptoms persist we will send you to the ENT M.D.

## 2015-08-10 NOTE — Progress Notes (Signed)
Subjective:     Patient ID: Michelle Parrish, female   DOB: Sep 12, 1940, 74 y.o.   MRN: [id number]  HPI  Chief Complaint  Patient presents with  . Ear Fullness    Patient comes in office today concerns about fullness in right ear. Patient reports yesteday during physical therapy when doing neck exercises, when she turned her head down towards the right and sat back up she became very dizzy.   States she has hx of allergies and asthma and her nose was runny the first few days of the week. "I feel like I am hearing through a tunnel." Does not tolerate prednisone and has had nosebleeds with steroid nasal sprays. Reports no further episode of vertigo.   Review of Systems  Constitutional: Negative for fever and chills.       Objective:   Physical Exam  Constitutional: She appears well-developed and well-nourished. No distress.  HENT:  Left Ear: Tympanic membrane normal.  Right T.M without inflammation. Diminished/distorted light reflex.       Assessment:    1. Eustachian tube dysfunction, right     Plan:   Try Claritin D or similar. ENT referral if not improving.

## 2015-08-11 ENCOUNTER — Ambulatory Visit: Payer: Medicare PPO | Admitting: Family Medicine

## 2015-08-15 DIAGNOSIS — M25511 Pain in right shoulder: Secondary | ICD-10-CM | POA: Diagnosis not present

## 2015-08-17 DIAGNOSIS — M25511 Pain in right shoulder: Secondary | ICD-10-CM | POA: Diagnosis not present

## 2015-08-22 DIAGNOSIS — M25511 Pain in right shoulder: Secondary | ICD-10-CM | POA: Diagnosis not present

## 2015-08-24 DIAGNOSIS — M25511 Pain in right shoulder: Secondary | ICD-10-CM | POA: Diagnosis not present

## 2015-08-31 DIAGNOSIS — M25511 Pain in right shoulder: Secondary | ICD-10-CM | POA: Diagnosis not present

## 2015-09-01 ENCOUNTER — Other Ambulatory Visit: Payer: Self-pay | Admitting: Family Medicine

## 2015-09-04 DIAGNOSIS — R0602 Shortness of breath: Secondary | ICD-10-CM | POA: Diagnosis not present

## 2015-09-04 DIAGNOSIS — X58XXXA Exposure to other specified factors, initial encounter: Secondary | ICD-10-CM | POA: Diagnosis not present

## 2015-09-04 DIAGNOSIS — I10 Essential (primary) hypertension: Secondary | ICD-10-CM | POA: Diagnosis not present

## 2015-09-04 DIAGNOSIS — R42 Dizziness and giddiness: Secondary | ICD-10-CM | POA: Diagnosis not present

## 2015-09-05 DIAGNOSIS — M25511 Pain in right shoulder: Secondary | ICD-10-CM | POA: Diagnosis not present

## 2015-09-07 DIAGNOSIS — M25511 Pain in right shoulder: Secondary | ICD-10-CM | POA: Diagnosis not present

## 2015-09-12 DIAGNOSIS — Z87828 Personal history of other (healed) physical injury and trauma: Secondary | ICD-10-CM | POA: Diagnosis not present

## 2015-09-12 DIAGNOSIS — H8113 Benign paroxysmal vertigo, bilateral: Secondary | ICD-10-CM | POA: Diagnosis not present

## 2015-09-12 DIAGNOSIS — S12121S Other nondisplaced dens fracture, sequela: Secondary | ICD-10-CM | POA: Diagnosis not present

## 2015-09-14 ENCOUNTER — Other Ambulatory Visit: Payer: Self-pay | Admitting: Neurology

## 2015-09-14 DIAGNOSIS — R42 Dizziness and giddiness: Secondary | ICD-10-CM

## 2015-09-14 DIAGNOSIS — M25511 Pain in right shoulder: Secondary | ICD-10-CM | POA: Diagnosis not present

## 2015-09-14 DIAGNOSIS — S0990XS Unspecified injury of head, sequela: Principal | ICD-10-CM

## 2015-09-15 ENCOUNTER — Other Ambulatory Visit: Payer: Self-pay | Admitting: Neurology

## 2015-09-15 DIAGNOSIS — S0990XS Unspecified injury of head, sequela: Principal | ICD-10-CM

## 2015-09-15 DIAGNOSIS — R42 Dizziness and giddiness: Secondary | ICD-10-CM

## 2015-09-15 DIAGNOSIS — H8113 Benign paroxysmal vertigo, bilateral: Secondary | ICD-10-CM

## 2015-09-19 DIAGNOSIS — M25511 Pain in right shoulder: Secondary | ICD-10-CM | POA: Diagnosis not present

## 2015-09-21 DIAGNOSIS — Z1231 Encounter for screening mammogram for malignant neoplasm of breast: Secondary | ICD-10-CM | POA: Diagnosis not present

## 2015-09-21 DIAGNOSIS — Z01419 Encounter for gynecological examination (general) (routine) without abnormal findings: Secondary | ICD-10-CM | POA: Diagnosis not present

## 2015-09-22 DIAGNOSIS — M25511 Pain in right shoulder: Secondary | ICD-10-CM | POA: Diagnosis not present

## 2015-09-26 DIAGNOSIS — M25511 Pain in right shoulder: Secondary | ICD-10-CM | POA: Diagnosis not present

## 2015-09-28 DIAGNOSIS — M25511 Pain in right shoulder: Secondary | ICD-10-CM | POA: Diagnosis not present

## 2015-09-29 ENCOUNTER — Ambulatory Visit: Payer: Medicare PPO | Attending: Neurology | Admitting: Physical Therapy

## 2015-09-29 ENCOUNTER — Encounter: Payer: Self-pay | Admitting: Physical Therapy

## 2015-09-29 DIAGNOSIS — I493 Ventricular premature depolarization: Secondary | ICD-10-CM | POA: Insufficient documentation

## 2015-09-29 DIAGNOSIS — R011 Cardiac murmur, unspecified: Secondary | ICD-10-CM | POA: Insufficient documentation

## 2015-09-29 DIAGNOSIS — R42 Dizziness and giddiness: Secondary | ICD-10-CM | POA: Insufficient documentation

## 2015-09-29 DIAGNOSIS — Z800 Family history of malignant neoplasm of digestive organs: Secondary | ICD-10-CM

## 2015-09-29 NOTE — Addendum Note (Signed)
Addended by: Lorna Dibble on: 09/29/2015 04:05 PM   Modules accepted: Orders
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
## 2015-10-03 DIAGNOSIS — M25511 Pain in right shoulder: Secondary | ICD-10-CM | POA: Diagnosis not present

## 2015-10-04 ENCOUNTER — Ambulatory Visit: Admission: RE | Admit: 2015-10-04 | Payer: Medicare PPO | Source: Ambulatory Visit

## 2015-10-05 ENCOUNTER — Encounter: Payer: Self-pay | Admitting: Physical Therapy

## 2015-10-05 ENCOUNTER — Ambulatory Visit: Payer: Medicare PPO | Admitting: Physical Therapy

## 2015-10-05 DIAGNOSIS — R42 Dizziness and giddiness: Secondary | ICD-10-CM | POA: Diagnosis not present
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
## 2015-10-10 ENCOUNTER — Ambulatory Visit: Payer: Medicare PPO

## 2015-10-10 VITALS — BP 144/73 | HR 79

## 2015-10-10 DIAGNOSIS — R42 Dizziness and giddiness: Secondary | ICD-10-CM
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
## 2015-10-11 DIAGNOSIS — D225 Melanocytic nevi of trunk: Secondary | ICD-10-CM | POA: Diagnosis not present

## 2015-10-11 DIAGNOSIS — Z85828 Personal history of other malignant neoplasm of skin: Secondary | ICD-10-CM | POA: Diagnosis not present

## 2015-10-11 DIAGNOSIS — L538 Other specified erythematous conditions: Secondary | ICD-10-CM | POA: Diagnosis not present

## 2015-10-11 DIAGNOSIS — L570 Actinic keratosis: Secondary | ICD-10-CM | POA: Diagnosis not present

## 2015-10-11 DIAGNOSIS — L820 Inflamed seborrheic keratosis: Secondary | ICD-10-CM | POA: Diagnosis not present

## 2015-10-11 DIAGNOSIS — R208 Other disturbances of skin sensation: Secondary | ICD-10-CM | POA: Diagnosis not present

## 2015-10-11 DIAGNOSIS — X32XXXA Exposure to sunlight, initial encounter: Secondary | ICD-10-CM | POA: Diagnosis not present

## 2015-10-11 DIAGNOSIS — Z08 Encounter for follow-up examination after completed treatment for malignant neoplasm: Secondary | ICD-10-CM | POA: Diagnosis not present

## 2015-10-12 DIAGNOSIS — M25511 Pain in right shoulder: Secondary | ICD-10-CM | POA: Diagnosis not present

## 2015-10-18 ENCOUNTER — Encounter: Payer: Medicare PPO | Admitting: Physical Therapy

## 2015-10-19 DIAGNOSIS — M25511 Pain in right shoulder: Secondary | ICD-10-CM | POA: Diagnosis not present

## 2015-10-20 ENCOUNTER — Telehealth (HOSPITAL_COMMUNITY): Payer: Self-pay | Admitting: Neurology

## 2015-10-23 ENCOUNTER — Ambulatory Visit: Payer: Medicare PPO

## 2015-10-23 ENCOUNTER — Telehealth: Payer: Self-pay | Admitting: Radiology

## 2015-10-25 DIAGNOSIS — M25511 Pain in right shoulder: Secondary | ICD-10-CM | POA: Diagnosis not present

## 2015-10-27 ENCOUNTER — Ambulatory Visit: Payer: Medicare PPO | Attending: Neurology | Admitting: Physical Therapy

## 2015-10-27 ENCOUNTER — Encounter: Payer: Self-pay | Admitting: Physical Therapy

## 2015-10-27 DIAGNOSIS — R42 Dizziness and giddiness: Secondary | ICD-10-CM | POA: Insufficient documentation
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
## 2015-10-31 ENCOUNTER — Ambulatory Visit
Admission: RE | Admit: 2015-10-31 | Discharge: 2015-10-31 | Disposition: A | Discharge status: Home / Self Care | Payer: Medicare PPO | Source: Ambulatory Visit | Attending: Neurology | Admitting: Neurology

## 2015-10-31 DIAGNOSIS — Z87828 Personal history of other (healed) physical injury and trauma: Secondary | ICD-10-CM | POA: Insufficient documentation

## 2015-10-31 DIAGNOSIS — I679 Cerebrovascular disease, unspecified: Secondary | ICD-10-CM | POA: Insufficient documentation

## 2015-10-31 DIAGNOSIS — H8113 Benign paroxysmal vertigo, bilateral: Secondary | ICD-10-CM

## 2015-10-31 DIAGNOSIS — S12100A Unspecified displaced fracture of second cervical vertebra, initial encounter for closed fracture: Secondary | ICD-10-CM | POA: Insufficient documentation

## 2015-10-31 DIAGNOSIS — S0990XS Unspecified injury of head, sequela: Secondary | ICD-10-CM

## 2015-10-31 DIAGNOSIS — R42 Dizziness and giddiness: Secondary | ICD-10-CM | POA: Diagnosis not present

## 2015-10-31 LAB — POCT I-STAT CREATININE: CREATININE: 0.6 mg/dL (ref 0.44–1.00)

## 2015-10-31 MED ORDER — GADOBENATE DIMEGLUMINE 529 MG/ML IV SOLN
15.0000 mL | Freq: Once | INTRAVENOUS | Status: AC | PRN
  Administered 2015-10-31: 12 mL via INTRAVENOUS

## 2015-11-03 DIAGNOSIS — E236 Other disorders of pituitary gland: Secondary | ICD-10-CM | POA: Diagnosis not present

## 2015-11-03 DIAGNOSIS — E237 Disorder of pituitary gland, unspecified: Secondary | ICD-10-CM | POA: Diagnosis not present

## 2015-11-03 DIAGNOSIS — R930 Abnormal findings on diagnostic imaging of skull and head, not elsewhere classified: Secondary | ICD-10-CM | POA: Diagnosis not present

## 2015-11-06 ENCOUNTER — Ambulatory Visit: Payer: Medicare PPO | Admitting: Family Medicine

## 2015-11-08 DIAGNOSIS — M25511 Pain in right shoulder: Secondary | ICD-10-CM | POA: Diagnosis not present

## 2015-11-09 ENCOUNTER — Ambulatory Visit: Payer: No Typology Code available for payment source

## 2015-11-09 DIAGNOSIS — S42021D Displaced fracture of shaft of right clavicle, subsequent encounter for fracture with routine healing: Secondary | ICD-10-CM | POA: Diagnosis not present

## 2015-11-20 ENCOUNTER — Ambulatory Visit: Payer: Medicare PPO | Admitting: Family Medicine

## 2015-11-23 ENCOUNTER — Encounter: Payer: Self-pay | Admitting: Family Medicine

## 2015-11-23 ENCOUNTER — Ambulatory Visit (INDEPENDENT_AMBULATORY_CARE_PROVIDER_SITE_OTHER): Payer: Medicare PPO | Admitting: Family Medicine

## 2015-11-23 VITALS — BP 130/90 | HR 75 | Temp 98.1°F | Resp 16 | Wt 137.0 lb

## 2015-11-23 DIAGNOSIS — E559 Vitamin D deficiency, unspecified: Secondary | ICD-10-CM

## 2015-11-23 DIAGNOSIS — J45909 Unspecified asthma, uncomplicated: Secondary | ICD-10-CM

## 2015-11-23 DIAGNOSIS — I10 Essential (primary) hypertension: Secondary | ICD-10-CM | POA: Diagnosis not present

## 2015-11-23 DIAGNOSIS — J45998 Other asthma: Secondary | ICD-10-CM

## 2015-11-23 DIAGNOSIS — R358 Other polyuria: Secondary | ICD-10-CM

## 2015-11-23 DIAGNOSIS — R3589 Other polyuria: Secondary | ICD-10-CM

## 2015-11-23 LAB — POCT URINALYSIS DIPSTICK
Bilirubin, UA: NEGATIVE
Glucose, UA: NEGATIVE
Ketones, UA: NEGATIVE
Leukocytes, UA: NEGATIVE
NITRITE UA: NEGATIVE
PH UA: 7
PROTEIN UA: NEGATIVE
RBC UA: NEGATIVE
Spec Grav, UA: 1.01
UROBILINOGEN UA: 0.2

## 2015-11-23 MED ORDER — LISINOPRIL-HYDROCHLOROTHIAZIDE 20-12.5 MG PO TABS: 2.0000 | ORAL_TABLET | Freq: Every day | ORAL | Status: DC

## 2015-11-23 MED ORDER — AMLODIPINE BESYLATE 5 MG PO TABS: 5.0000 mg | ORAL_TABLET | Freq: Every day | ORAL | Status: DC
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
## 2015-11-24 LAB — RENAL FUNCTION PANEL
Albumin: 4.9 g/dL — ABNORMAL HIGH (ref 3.5–4.8)
BUN / CREAT RATIO: 14 (ref 12–28)
BUN: 8 mg/dL (ref 8–27)
CALCIUM: 9.9 mg/dL (ref 8.7–10.3)
CO2: 25 mmol/L (ref 18–29)
CREATININE: 0.59 mg/dL (ref 0.57–1.00)
Chloride: 92 mmol/L — ABNORMAL LOW (ref 96–106)
GFR calc Af Amer: 106 mL/min/{1.73_m2} (ref >59)
GFR, EST NON AFRICAN AMERICAN: 92 mL/min/{1.73_m2} (ref >59)
Glucose: 74 mg/dL (ref 65–99)
PHOSPHORUS: 4.4 mg/dL (ref 2.5–4.5)
Potassium: 4.7 mmol/L (ref 3.5–5.2)
Sodium: 134 mmol/L (ref 134–144)

## 2015-11-24 LAB — VITAMIN D 25 HYDROXY (VIT D DEFICIENCY, FRACTURES): VIT D 25 HYDROXY: 56.3 ng/mL (ref 30.0–100.0)

## 2015-11-24 LAB — TSH: TSH: 2.86 u[IU]/mL (ref 0.450–4.500)

## 2015-11-29 ENCOUNTER — Telehealth: Payer: Self-pay

## 2015-11-29 NOTE — Telephone Encounter (Signed)
Patient advised as directed below. 

## 2015-11-29 NOTE — Telephone Encounter (Signed)
-----   Message from Birdie Sons, MD sent at 11/29/2015  7:57 AM EDT ----- Blood sugar, kidney functions, electrolytes and thyroid and vitamin d levels are all normal. Continue current medications.  Check labs yearly.

## 2015-12-15 DIAGNOSIS — R930 Abnormal findings on diagnostic imaging of skull and head, not elsewhere classified: Secondary | ICD-10-CM | POA: Diagnosis not present

## 2015-12-15 DIAGNOSIS — N390 Urinary tract infection, site not specified: Secondary | ICD-10-CM | POA: Diagnosis not present

## 2015-12-15 DIAGNOSIS — E538 Deficiency of other specified B group vitamins: Secondary | ICD-10-CM | POA: Diagnosis not present

## 2015-12-15 DIAGNOSIS — H8113 Benign paroxysmal vertigo, bilateral: Secondary | ICD-10-CM | POA: Diagnosis not present

## 2015-12-15 DIAGNOSIS — E559 Vitamin D deficiency, unspecified: Secondary | ICD-10-CM | POA: Diagnosis not present

## 2015-12-15 DIAGNOSIS — Z87828 Personal history of other (healed) physical injury and trauma: Secondary | ICD-10-CM | POA: Diagnosis not present

## 2015-12-15 DIAGNOSIS — E236 Other disorders of pituitary gland: Secondary | ICD-10-CM | POA: Diagnosis not present

## 2016-01-11 ENCOUNTER — Other Ambulatory Visit: Payer: Self-pay | Admitting: Family Medicine

## 2016-01-18 ENCOUNTER — Other Ambulatory Visit: Payer: Self-pay | Admitting: Family Medicine
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
## 2016-02-26 DIAGNOSIS — I10 Essential (primary) hypertension: Secondary | ICD-10-CM | POA: Diagnosis not present

## 2016-02-26 DIAGNOSIS — R011 Cardiac murmur, unspecified: Secondary | ICD-10-CM | POA: Diagnosis not present

## 2016-03-06 DIAGNOSIS — H2511 Age-related nuclear cataract, right eye: Secondary | ICD-10-CM | POA: Diagnosis not present

## 2016-05-07 DIAGNOSIS — H2511 Age-related nuclear cataract, right eye: Secondary | ICD-10-CM | POA: Diagnosis not present

## 2016-05-09 ENCOUNTER — Encounter: Payer: Self-pay | Admitting: *Deleted

## 2016-05-14 ENCOUNTER — Encounter: Payer: Self-pay | Admitting: *Deleted

## 2016-05-14 ENCOUNTER — Encounter
Admission: RE | Disposition: A | Discharge status: Home / Self Care | Payer: Self-pay | Source: Ambulatory Visit | Attending: Ophthalmology

## 2016-05-14 ENCOUNTER — Ambulatory Visit
Admission: RE | Admit: 2016-05-14 | Discharge: 2016-05-14 | Disposition: A | Discharge status: Home / Self Care | Payer: Medicare PPO | Source: Ambulatory Visit | Attending: Ophthalmology | Admitting: Ophthalmology

## 2016-05-14 ENCOUNTER — Ambulatory Visit: Payer: Medicare PPO | Admitting: Certified Registered"

## 2016-05-14 DIAGNOSIS — R011 Cardiac murmur, unspecified: Secondary | ICD-10-CM | POA: Diagnosis not present

## 2016-05-14 DIAGNOSIS — I10 Essential (primary) hypertension: Secondary | ICD-10-CM | POA: Diagnosis not present

## 2016-05-14 DIAGNOSIS — Z853 Personal history of malignant neoplasm of breast: Secondary | ICD-10-CM | POA: Diagnosis not present

## 2016-05-14 DIAGNOSIS — H2511 Age-related nuclear cataract, right eye: Secondary | ICD-10-CM | POA: Insufficient documentation

## 2016-05-14 DIAGNOSIS — I739 Peripheral vascular disease, unspecified: Secondary | ICD-10-CM | POA: Diagnosis not present

## 2016-05-14 DIAGNOSIS — Z9010 Acquired absence of unspecified breast and nipple: Secondary | ICD-10-CM | POA: Diagnosis not present

## 2016-05-14 DIAGNOSIS — Z888 Allergy status to other drugs, medicaments and biological substances status: Secondary | ICD-10-CM | POA: Diagnosis not present

## 2016-05-14 DIAGNOSIS — Z91041 Radiographic dye allergy status: Secondary | ICD-10-CM | POA: Diagnosis not present

## 2016-05-14 DIAGNOSIS — Z882 Allergy status to sulfonamides status: Secondary | ICD-10-CM | POA: Insufficient documentation

## 2016-05-14 DIAGNOSIS — E1151 Type 2 diabetes mellitus with diabetic peripheral angiopathy without gangrene: Secondary | ICD-10-CM | POA: Insufficient documentation

## 2016-05-14 DIAGNOSIS — Z885 Allergy status to narcotic agent status: Secondary | ICD-10-CM | POA: Diagnosis not present

## 2016-05-14 DIAGNOSIS — J45909 Unspecified asthma, uncomplicated: Secondary | ICD-10-CM | POA: Diagnosis not present

## 2016-05-14 HISTORY — DX: Fracture of neck, unspecified, initial encounter: S12.9XXA

## 2016-05-14 HISTORY — DX: Other complications of anesthesia, initial encounter: T88.59XA

## 2016-05-14 HISTORY — PX: CATARACT EXTRACTION W/PHACO: SHX586

## 2016-05-14 HISTORY — DX: Pain, unspecified: R52

## 2016-05-14 HISTORY — DX: Dizziness and giddiness: R42

## 2016-05-14 HISTORY — DX: Adverse effect of unspecified anesthetic, initial encounter: T41.45XA

## 2016-05-14 HISTORY — DX: Other specified postprocedural states: Z98.890

## 2016-05-14 HISTORY — DX: Disorder of pituitary gland, unspecified: E23.7

## 2016-05-14 HISTORY — DX: Nausea with vomiting, unspecified: R11.2

## 2016-05-14 SURGERY — PHACOEMULSIFICATION, CATARACT, WITH IOL INSERTION
Anesthesia: Monitor Anesthesia Care | Site: Eye | Laterality: Right | Wound class: Clean

## 2016-05-14 MED ORDER — MOXIFLOXACIN HCL 0.5 % OP SOLN
OPHTHALMIC | Status: AC
  Administered 2016-05-14: 1 [drp] via OPHTHALMIC
  Filled 2016-05-14: qty 3

## 2016-05-14 MED ORDER — MOXIFLOXACIN HCL 0.5 % OP SOLN
1.0000 [drp] | OPHTHALMIC | Status: AC
  Administered 2016-05-14 (×3): 1 [drp] via OPHTHALMIC

## 2016-05-14 MED ORDER — PHENYLEPHRINE HCL 10 % OP SOLN: 1.0000 [drp] | Freq: Once | OPHTHALMIC | Status: DC

## 2016-05-14 MED ORDER — LIDOCAINE HCL 3.5 % OP GEL
OPHTHALMIC | Status: AC
  Administered 2016-05-14: 1 via OPHTHALMIC
  Filled 2016-05-14: qty 1

## 2016-05-14 MED ORDER — POVIDONE-IODINE 5 % OP SOLN
1.0000 "application " | Freq: Once | OPHTHALMIC | Status: AC
  Administered 2016-05-14: 1 via OPHTHALMIC

## 2016-05-14 MED ORDER — POVIDONE-IODINE 5 % OP SOLN
OPHTHALMIC | Status: AC
  Administered 2016-05-14: 1 via OPHTHALMIC
  Filled 2016-05-14: qty 30

## 2016-05-14 MED ORDER — PHENYLEPHRINE HCL 10 % OP SOLN: 1.0000 [drp] | OPHTHALMIC | Status: DC

## 2016-05-14 MED ORDER — LIDOCAINE HCL 3.5 % OP GEL
1.0000 "application " | Freq: Once | OPHTHALMIC | Status: AC
  Administered 2016-05-14: 1 via OPHTHALMIC

## 2016-05-14 MED ORDER — CYCLOPENTOLATE HCL 2 % OP SOLN
OPHTHALMIC | Status: AC
  Administered 2016-05-14: 1 [drp] via OPHTHALMIC
  Filled 2016-05-14: qty 2

## 2016-05-14 MED ORDER — MIDAZOLAM HCL 2 MG/2ML IJ SOLN
INTRAMUSCULAR | Status: DC | PRN
  Administered 2016-05-14: 1 mg via INTRAVENOUS

## 2016-05-14 MED ORDER — CYCLOPENTOLATE HCL 2 % OP SOLN
1.0000 [drp] | OPHTHALMIC | Status: AC
  Administered 2016-05-14 (×4): 1 [drp] via OPHTHALMIC

## 2016-05-14 MED ORDER — CYCLOPENTOLATE HCL 2 % OP SOLN: 1.0000 [drp] | OPHTHALMIC | Status: DC

## 2016-05-14 MED ORDER — CEFUROXIME OPHTHALMIC INJECTION 1 MG/0.1 ML
INJECTION | OPHTHALMIC | Status: DC | PRN
  Administered 2016-05-14: .1 mL via INTRACAMERAL

## 2016-05-14 MED ORDER — MOXIFLOXACIN HCL 0.5 % OP SOLN: 1.0000 [drp] | OPHTHALMIC | Status: DC

## 2016-05-14 MED ORDER — EPINEPHRINE HCL 1 MG/ML IJ SOLN
INTRAOCULAR | Status: DC | PRN
  Administered 2016-05-14: 1 mL via OPHTHALMIC

## 2016-05-14 MED ORDER — CEFUROXIME OPHTHALMIC INJECTION 1 MG/0.1 ML
INJECTION | OPHTHALMIC | Status: AC
  Filled 2016-05-14: qty 0.1

## 2016-05-14 MED ORDER — PHENYLEPHRINE HCL 10 % OP SOLN
1.0000 [drp] | OPHTHALMIC | Status: AC
  Administered 2016-05-14 (×4): 1 [drp] via OPHTHALMIC

## 2016-05-14 MED ORDER — MOXIFLOXACIN HCL 0.5 % OP SOLN
OPHTHALMIC | Status: DC | PRN
  Administered 2016-05-14: 1 [drp] via OPHTHALMIC

## 2016-05-14 MED ORDER — NA CHONDROIT SULF-NA HYALURON 40-17 MG/ML IO SOLN
INTRAOCULAR | Status: AC
  Filled 2016-05-14: qty 1

## 2016-05-14 MED ORDER — NA CHONDROIT SULF-NA HYALURON 40-17 MG/ML IO SOLN
INTRAOCULAR | Status: DC | PRN
  Administered 2016-05-14: 1 mL via INTRAOCULAR

## 2016-05-14 MED ORDER — TETRACAINE HCL 0.5 % OP SOLN
1.0000 [drp] | Freq: Once | OPHTHALMIC | Status: AC
  Administered 2016-05-14 (×2): 1 [drp] via OPHTHALMIC

## 2016-05-14 MED ORDER — CYCLOPENTOLATE HCL 2 % OP SOLN: 1.0000 [drp] | Freq: Once | OPHTHALMIC | Status: DC

## 2016-05-14 MED ORDER — SODIUM CHLORIDE 0.9 % IV SOLN
INTRAVENOUS | Status: DC
  Administered 2016-05-14: 09:00:00 via INTRAVENOUS

## 2016-05-14 MED ORDER — EPINEPHRINE HCL 1 MG/ML IJ SOLN
INTRAMUSCULAR | Status: AC
  Filled 2016-05-14: qty 1

## 2016-05-14 MED ORDER — MOXIFLOXACIN HCL 0.5 % OP SOLN: 1.0000 [drp] | Freq: Once | OPHTHALMIC | Status: DC

## 2016-05-14 MED ORDER — PHENYLEPHRINE HCL 10 % OP SOLN
OPHTHALMIC | Status: AC
  Administered 2016-05-14: 1 [drp] via OPHTHALMIC
  Filled 2016-05-14: qty 5

## 2016-05-14 MED ORDER — CARBACHOL 0.01 % IO SOLN
INTRAOCULAR | Status: DC | PRN
  Administered 2016-05-14: .5 mL via INTRAOCULAR

## 2016-05-14 SURGICAL SUPPLY — 21 items
CANNULA ANT/CHMB 27GA (MISCELLANEOUS) ×3 IMPLANT
CUP MEDICINE 2OZ PLAST GRAD ST (MISCELLANEOUS) ×3 IMPLANT
GLOVE BIO SURGEON STRL SZ8 (GLOVE) ×3 IMPLANT
GLOVE BIOGEL M 6.5 STRL (GLOVE) ×3 IMPLANT
GLOVE SURG LX 8.0 MICRO (GLOVE) ×2
GLOVE SURG LX STRL 8.0 MICRO (GLOVE) ×1 IMPLANT
GOWN STRL REUS W/ TWL LRG LVL3 (GOWN DISPOSABLE) ×2 IMPLANT
GOWN STRL REUS W/TWL LRG LVL3 (GOWN DISPOSABLE) ×4
LENS IOL TECNIS ITEC 19.0 (Intraocular Lens) ×3 IMPLANT
PACK CATARACT (MISCELLANEOUS) ×3 IMPLANT
PACK CATARACT BRASINGTON LX (MISCELLANEOUS) ×3 IMPLANT
PACK EYE AFTER SURG (MISCELLANEOUS) ×3 IMPLANT
SOL BSS BAG (MISCELLANEOUS) ×3
SOL PREP PVP 2OZ (MISCELLANEOUS) ×3
SOLUTION BSS BAG (MISCELLANEOUS) ×1 IMPLANT
SOLUTION PREP PVP 2OZ (MISCELLANEOUS) ×1 IMPLANT
SYR 3ML LL SCALE MARK (SYRINGE) ×3 IMPLANT
SYR 5ML LL (SYRINGE) ×3 IMPLANT
SYR TB 1ML 27GX1/2 LL (SYRINGE) ×3 IMPLANT
WATER STERILE IRR 250ML POUR (IV SOLUTION) ×3 IMPLANT
WIPE NON LINTING 3.25X3.25 (MISCELLANEOUS) ×3 IMPLANT

## 2016-05-14 NOTE — Discharge Instructions (Addendum)
Cataract Surgery, Care After Refer to this sheet in the next few weeks. These instructions provide you with information on caring for yourself after your procedure. Your caregiver may also give you more specific instructions. Your treatment has been planned according to current medical practices, but problems sometimes occur. Call your caregiver if you have any problems or questions after your procedure.  HOME CARE INSTRUCTIONS   Avoid strenuous activities as directed by your caregiver.  Ask your caregiver when you can resume driving.  Use eyedrops or other medicines to help healing and control pressure inside your eye as directed by your caregiver.  Only take over-the-counter or prescription medicines for pain, discomfort, or fever as directed by your caregiver.  Do not to touch or rub your eyes.  You may be instructed to use a protective shield during the first few days and nights after surgery. If not, wear sunglasses to protect your eyes. This is to protect the eye from pressure or from being accidentally bumped.  Keep the area around your eye clean and dry. Avoid swimming or allowing water to hit you directly in the face while showering. Keep soap and shampoo out of your eyes.  Do not bend or lift heavy objects. Bending increases pressure in the eye. You can walk, climb stairs, and do light household chores.  Do not put a contact lens into the eye that had surgery until your caregiver says it is okay to do so.  Ask your doctor when you can return to work. This will depend on the kind of work that you do. If you work in a dusty environment, you may be advised to wear protective eyewear for a period of time.  Ask your caregiver when it will be safe to engage in sexual activity.  Continue with your regular eye exams as directed by your caregiver. What to expect:  It is normal to feel itching and mild discomfort for a few days after cataract surgery. Some fluid discharge is also common,  and your eye may be sensitive to light and touch.  After 1 to 2 days, even moderate discomfort should disappear. In most cases, healing will take about 6 weeks.  If you received an intraocular lens (IOL), you may notice that colors are very bright or have a blue tinge. Also, if you have been in bright sunlight, everything may appear reddish for a few hours. If you see these color tinges, it is because your lens is clear and no longer cloudy. Within a few months after receiving an IOL, these extra colors should go away. When you have healed, you will probably need new glasses. SEEK MEDICAL CARE IF:   You have increased bruising around your eye.  You have discomfort not helped by medicine. SEEK IMMEDIATE MEDICAL CARE IF:   You have a fever.  You have a worsening or sudden vision loss.  You have redness, swelling, or increasing pain in the eye.  You have a thick discharge from the eye that had surgery. MAKE SURE YOU:  Understand these instructions.  Will watch your condition.  Will get help right away if you are not doing well or get worse.   This information is not intended to replace advice given to you by your health care provider. Make sure you discuss any questions you have with your health care provider.   Document Released: 02/22/2005 Document Revised: 08/26/2014 Document Reviewed: 03/29/2011 Elsevier Interactive Patient Education 2016 [employer] Follow Dr. Inda Coke postop eye drop sheet as  reviewed.  A cataract is a clouding of the lens of the eye. When a lens becomes cloudy, vision is reduced based on the degree and nature of the clouding. Surgery may be needed to improve vision. Surgery removes the cloudy lens and usually replaces it with a substitute lens (intraocular lens, IOL). LET YOUR EYE DOCTOR KNOW ABOUT:  Allergies to food or medicine.  Medicines taken including herbs, eye drops, over-the-counter medicines, and creams.  Use of steroids (by mouth or  creams).  Previous problems with anesthetics or numbing medicine.  History of bleeding problems or blood clots.  Previous surgery.  Other health problems, including diabetes and kidney problems.  Possibility of pregnancy, if this applies. RISKS AND COMPLICATIONS  Infection.  Inflammation of the eyeball (endophthalmitis) that can spread to both eyes (sympathetic ophthalmia).  Poor wound healing.  If an IOL is inserted, it can later fall out of proper position. This is very uncommon.  Clouding of the part of your eye that holds an IOL in place. This is called an "after-cataract." These are uncommon but easily treated. BEFORE THE PROCEDURE  Do not eat or drink anything except small amounts of water for 8 to 12 before your surgery, or as directed by your caregiver.  Unless you are told otherwise, continue any eye drops you have been prescribed.  Talk to your primary caregiver about all other medicines that you take (both prescription and nonprescription). In some cases, you may need to stop or change medicines near the time of your surgery. This is most important if you are taking blood-thinning medicine.Do not stop medicines unless you are told to do so.  Arrange for someone to drive you to and from the procedure.  Do not put contact lenses in either eye on the day of your surgery. PROCEDURE There is more than one method for safely removing a cataract. Your doctor can explain the differences and help determine which is best for you. Phacoemulsification surgery is the most common form of cataract surgery.  An injection is given behind the eye or eye drops are given to make this a painless procedure.  A small cut (incision) is made on the edge of the clear, dome-shaped surface that covers the front of the eye (cornea).  A tiny probe is painlessly inserted into the eye. This device gives off ultrasound waves that soften and break up the cloudy center of the lens. This makes it  easier for the cloudy lens to be removed by suction.  An IOL may be implanted.  The normal lens of the eye is covered by a clear capsule. Part of that capsule is intentionally left in the eye to support the IOL.  Your surgeon may or may not use stitches to close the incision. There are other forms of cataract surgery that require a larger incision and stitches to close the eye. This approach is taken in cases where the doctor feels that the cataract cannot be easily removed using phacoemulsification. AFTER THE PROCEDURE  When an IOL is implanted, it does not need care. It becomes a permanent part of your eye and cannot be seen or felt.  Your doctor will schedule follow-up exams to check on your progress.  Review your other medicines with your doctor to see which can be resumed after surgery.  Use eye drops or take medicine as prescribed by your doctor.   This information is not intended to replace advice given to you by your health care provider. Make sure you  discuss any questions you have with your health care provider.   Document Released: 07/25/2011 Document Revised: 08/26/2014 Document Reviewed: 07/25/2011 Elsevier Interactive Patient Education 2016 [employer]  Follow Dr. Inda Coke drop schedule and dc instructions  Eye Surgery Discharge Instructions  Expect mild scratchy sensation or mild soreness. DO NOT RUB YOUR EYE!  The day of surgery:  Minimal physical activity, but bed rest is not required  No reading, computer work, or close hand work  No bending, lifting, or straining.  May watch TV  For 24 hours:  No driving, legal decisions, or alcoholic beverages  Safety precautions  Eat anything you prefer: It is better to start with liquids, then soup then solid foods.  _____ Eye patch should be worn until postoperative exam tomorrow.  ____ Solar shield eyeglasses should be worn for comfort in the sunlight/patch while sleeping  Resume all regular medications  including aspirin or Coumadin if these were discontinued prior to surgery. You may shower, bathe, shave, or wash your hair. Tylenol may be taken for mild discomfort.  Call your doctor if you experience significant pain, nausea, or vomiting, fever > 101 or other signs of infection. [phone] or [phone] Specific instructions:   Follow Dr. Inda Coke postop eye drop instruction sheet as reviewed.   Follow-up Information    Michelle Parrish,Michelle LOUIS, MD. Go today.   Specialty:  Ophthalmology Why:  as previously scheduled Contact information: [address] [place] [address] [phone]

## 2016-05-14 NOTE — Transfer of Care (Signed)
Immediate Anesthesia Transfer of Care Note  Patient: Michelle Parrish  Procedure(s) Performed: Procedure(s) with comments: CATARACT EXTRACTION PHACO AND INTRAOCULAR LENS PLACEMENT (IOC) (Right) - [place] 00:49.2 AP% 15.4 CDE 7.55 Fluid pack lot # [id number]  Patient Location: Short Stay  Anesthesia Type:MAC  Level of Consciousness: awake, alert , oriented and patient cooperative  Airway & Oxygen Therapy: Patient Spontanous Breathing  Post-op Assessment: Report given to RN, Post -op Vital signs reviewed and stable and Patient moving all extremities X 4  Post vital signs: Reviewed and stable  Last Vitals:  Vitals:   05/14/16 0741  BP: (!) 142/81  Pulse: 73  Resp: 16  Temp: 36.4 C    Last Pain:  Vitals:   05/14/16 0741  TempSrc: Tympanic         Complications: No apparent anesthesia complications

## 2016-05-14 NOTE — Op Note (Signed)
PREOPERATIVE DIAGNOSIS:  Nuclear sclerotic cataract of the right eye.   POSTOPERATIVE DIAGNOSIS: nuclear sclerotic cataract right eye   OPERATIVE PROCEDURE:  Procedure(s): CATARACT EXTRACTION PHACO AND INTRAOCULAR LENS PLACEMENT (IOC)   SURGEON:  Birder Robson, MD.   ANESTHESIA:  Anesthesiologist: Molli Barrows, MD CRNA: Rolla Plate, CRNA; Silvana Newness, CRNA  1.      Managed anesthesia care. 2.      Topical tetracaine drops followed by 2% Xylocaine jelly applied in the preoperative holding area.   COMPLICATIONS:  None.   TECHNIQUE:   Stop and chop   DESCRIPTION OF PROCEDURE:  The patient was examined and consented in the preoperative holding area where the aforementioned topical anesthesia was applied to the right eye and then brought back to the Operating Room where the right eye was prepped and draped in the usual sterile ophthalmic fashion and a lid speculum was placed. A paracentesis was created with the side port blade and the anterior chamber was filled with viscoelastic. A near clear corneal incision was performed with the steel keratome. A continuous curvilinear capsulorrhexis was performed with a cystotome followed by the capsulorrhexis forceps. Hydrodissection and hydrodelineation were carried out with BSS on a blunt cannula. The lens was removed in a stop and chop  technique and the remaining cortical material was removed with the irrigation-aspiration handpiece. The capsular bag was inflated with viscoelastic and the Technis ZCB00  lens was placed in the capsular bag without complication. The remaining viscoelastic was removed from the eye with the irrigation-aspiration handpiece. The wounds were hydrated. The anterior chamber was flushed with Miostat and the eye was inflated to physiologic pressure. 0.1 mL of cefuroxime concentration 10 mg/mL was placed in the anterior chamber. The wounds were found to be water tight. The eye was dressed with Vigamox. The patient was given  protective glasses to wear throughout the day and a shield with which to sleep tonight. The patient was also given drops with which to begin a drop regimen today and will follow-up with me in one day.  Implant Name Type Inv. Item Serial No. Manufacturer Lot No. LRB No. Used  LENS IOL DIOP 19.0 - [id number] Intraocular Lens LENS IOL DIOP 19.0 [id number]   Right 1   Procedure(s) with comments: CATARACT EXTRACTION PHACO AND INTRAOCULAR LENS PLACEMENT (IOC) (Right) - [place] 00:49.2 AP% 15.4 CDE 7.55 Fluid pack lot # [id number]  Electronically signed: [place] 05/14/2016 9:14 AM

## 2016-05-14 NOTE — Anesthesia Postprocedure Evaluation (Signed)
Anesthesia Post Note  Patient: Michelle Parrish  Procedure(s) Performed: Procedure(s) (LRB): CATARACT EXTRACTION PHACO AND INTRAOCULAR LENS PLACEMENT (IOC) (Right)  Patient location during evaluation: PACU Anesthesia Type: General Level of consciousness: awake and alert Pain management: pain level controlled Vital Signs Assessment: post-procedure vital signs reviewed and stable Respiratory status: spontaneous breathing, nonlabored ventilation, respiratory function stable and patient connected to nasal cannula oxygen Cardiovascular status: blood pressure returned to baseline and stable Postop Assessment: no signs of nausea or vomiting Anesthetic complications: no    Last Vitals:  Vitals:   05/14/16 0917 05/14/16 0926  BP: 120/70 116/71  Pulse: 69 65  Resp: 16 16  Temp: 37 C     Last Pain:  Vitals:   05/14/16 0917  TempSrc: Oral                 Molli Barrows

## 2016-05-14 NOTE — H&P (Signed)
All labs reviewed. Abnormal studies sent to patients PCP when indicated.  Previous H&P reviewed, patient examined, there are NO CHANGES.  Michelle Parrish LOUIS9/26/20178:51 AM

## 2016-05-14 NOTE — Anesthesia Preprocedure Evaluation (Signed)
Anesthesia Evaluation  Patient identified by MRN, date of birth, ID band Patient awake    Reviewed: Allergy & Precautions, H&P , NPO status , Patient's Chart, lab work & pertinent test results, reviewed documented beta blocker date and time   History of Anesthesia Complications (+) PONV and history of anesthetic complications  Airway Mallampati: II  TM Distance: >3 FB Neck ROM: full    Dental no notable dental hx. (+) Teeth Intact   Pulmonary neg pulmonary ROS, neg shortness of breath, asthma ,    Pulmonary exam normal breath sounds clear to auscultation       Cardiovascular Exercise Tolerance: Good hypertension, + Peripheral Vascular Disease  negative cardio ROS  + dysrhythmias + Valvular Problems/Murmurs  Rhythm:regular Rate:Normal     Neuro/Psych  Headaches, negative neurological ROS  negative psych ROS   GI/Hepatic negative GI ROS, Neg liver ROS,   Endo/Other  negative endocrine ROSdiabetes  Renal/GU      Musculoskeletal   Abdominal   Peds  Hematology negative hematology ROS (+)   Anesthesia Other Findings   Reproductive/Obstetrics negative OB ROS                             Anesthesia Physical Anesthesia Plan  ASA: III  Anesthesia Plan: MAC   Post-op Pain Management:    Induction:   Airway Management Planned:   Additional Equipment:   Intra-op Plan:   Post-operative Plan:   Informed Consent: I have reviewed the patients History and Physical, chart, labs and discussed the procedure including the risks, benefits and alternatives for the proposed anesthesia with the patient or authorized representative who has indicated his/her understanding and acceptance.     Plan Discussed with: CRNA  Anesthesia Plan Comments:         Anesthesia Quick Evaluation

## 2016-05-24 DIAGNOSIS — H2512 Age-related nuclear cataract, left eye: Secondary | ICD-10-CM | POA: Diagnosis not present

## 2016-05-28 ENCOUNTER — Encounter: Payer: Self-pay | Admitting: *Deleted

## 2016-06-04 ENCOUNTER — Ambulatory Visit: Payer: Medicare PPO | Admitting: Registered Nurse

## 2016-06-04 ENCOUNTER — Encounter: Payer: Self-pay | Admitting: *Deleted

## 2016-06-04 ENCOUNTER — Encounter
Admission: RE | Disposition: A | Discharge status: Home / Self Care | Payer: Self-pay | Source: Ambulatory Visit | Attending: Ophthalmology

## 2016-06-04 ENCOUNTER — Ambulatory Visit
Admission: RE | Admit: 2016-06-04 | Discharge: 2016-06-04 | Disposition: A | Discharge status: Home / Self Care | Payer: Medicare PPO | Source: Ambulatory Visit | Attending: Ophthalmology | Admitting: Ophthalmology

## 2016-06-04 DIAGNOSIS — Z79899 Other long term (current) drug therapy: Secondary | ICD-10-CM | POA: Insufficient documentation

## 2016-06-04 DIAGNOSIS — H2512 Age-related nuclear cataract, left eye: Secondary | ICD-10-CM | POA: Diagnosis not present

## 2016-06-04 DIAGNOSIS — I739 Peripheral vascular disease, unspecified: Secondary | ICD-10-CM | POA: Diagnosis not present

## 2016-06-04 DIAGNOSIS — Z853 Personal history of malignant neoplasm of breast: Secondary | ICD-10-CM | POA: Diagnosis not present

## 2016-06-04 DIAGNOSIS — J45909 Unspecified asthma, uncomplicated: Secondary | ICD-10-CM | POA: Diagnosis not present

## 2016-06-04 DIAGNOSIS — I10 Essential (primary) hypertension: Secondary | ICD-10-CM | POA: Insufficient documentation

## 2016-06-04 HISTORY — PX: CATARACT EXTRACTION W/PHACO: SHX586

## 2016-06-04 HISTORY — DX: Edema, unspecified: R60.9

## 2016-06-04 SURGERY — PHACOEMULSIFICATION, CATARACT, WITH IOL INSERTION
Anesthesia: Monitor Anesthesia Care | Site: Eye | Laterality: Left | Wound class: Clean

## 2016-06-04 MED ORDER — MIDAZOLAM HCL 2 MG/2ML IJ SOLN
INTRAMUSCULAR | Status: DC | PRN
  Administered 2016-06-04: 1 mg via INTRAVENOUS

## 2016-06-04 MED ORDER — EPINEPHRINE PF 1 MG/ML IJ SOLN
INTRAMUSCULAR | Status: AC
  Filled 2016-06-04: qty 2

## 2016-06-04 MED ORDER — MOXIFLOXACIN HCL 0.5 % OP SOLN
OPHTHALMIC | Status: AC
  Administered 2016-06-04: 1 [drp] via OPHTHALMIC
  Filled 2016-06-04: qty 3

## 2016-06-04 MED ORDER — NA CHONDROIT SULF-NA HYALURON 40-17 MG/ML IO SOLN
INTRAOCULAR | Status: AC
  Filled 2016-06-04: qty 1

## 2016-06-04 MED ORDER — LIDOCAINE HCL (PF) 4 % IJ SOLN
INTRAMUSCULAR | Status: AC
  Filled 2016-06-04: qty 5

## 2016-06-04 MED ORDER — POVIDONE-IODINE 5 % OP SOLN
OPHTHALMIC | Status: AC
  Filled 2016-06-04: qty 30

## 2016-06-04 MED ORDER — MOXIFLOXACIN HCL 0.5 % OP SOLN
OPHTHALMIC | Status: DC | PRN
  Administered 2016-06-04: 9 [drp] via OPHTHALMIC

## 2016-06-04 MED ORDER — ONDANSETRON HCL 4 MG/2ML IJ SOLN
INTRAMUSCULAR | Status: DC | PRN
  Administered 2016-06-04: 4 mg via INTRAVENOUS

## 2016-06-04 MED ORDER — CARBACHOL 0.01 % IO SOLN
INTRAOCULAR | Status: DC | PRN
Start: 2016-06-04 — End: 2016-06-04
  Administered 2016-06-04: 0.5 mL via INTRAOCULAR

## 2016-06-04 MED ORDER — ARMC OPHTHALMIC DILATING DROPS
OPHTHALMIC | Status: AC
  Administered 2016-06-04: 1 via OPHTHALMIC
  Filled 2016-06-04: qty 0.4

## 2016-06-04 MED ORDER — LIDOCAINE HCL (PF) 4 % IJ SOLN
INTRAOCULAR | Status: DC | PRN
  Administered 2016-06-04: 4 mL via OPHTHALMIC

## 2016-06-04 MED ORDER — ARMC OPHTHALMIC DILATING DROPS
1.0000 | OPHTHALMIC | Status: AC
Start: 2016-06-04 — End: 2016-06-04
  Administered 2016-06-04 (×3): 1 via OPHTHALMIC

## 2016-06-04 MED ORDER — NA CHONDROIT SULF-NA HYALURON 40-17 MG/ML IO SOLN
INTRAOCULAR | Status: DC | PRN
Start: 2016-06-04 — End: 2016-06-04
  Administered 2016-06-04: 1 mL via INTRAOCULAR

## 2016-06-04 MED ORDER — MOXIFLOXACIN HCL 0.5 % OP SOLN
1.0000 [drp] | OPHTHALMIC | Status: AC
  Administered 2016-06-04 (×3): 1 [drp] via OPHTHALMIC

## 2016-06-04 MED ORDER — SODIUM CHLORIDE 0.9 % IV SOLN
INTRAVENOUS | Status: DC
  Administered 2016-06-04: 09:00:00 via INTRAVENOUS

## 2016-06-04 MED ORDER — EPINEPHRINE PF 1 MG/ML IJ SOLN
INTRAOCULAR | Status: DC | PRN
  Administered 2016-06-04: 250 mL via OPHTHALMIC

## 2016-06-04 MED ORDER — FENTANYL CITRATE (PF) 100 MCG/2ML IJ SOLN
INTRAMUSCULAR | Status: DC | PRN
  Administered 2016-06-04: 50 ug via INTRAVENOUS

## 2016-06-04 SURGICAL SUPPLY — 21 items
CANNULA ANT/CHMB 27GA (MISCELLANEOUS) ×3 IMPLANT
CUP MEDICINE 2OZ PLAST GRAD ST (MISCELLANEOUS) ×3 IMPLANT
GLOVE BIO SURGEON STRL SZ8 (GLOVE) ×3 IMPLANT
GLOVE BIOGEL M 6.5 STRL (GLOVE) ×3 IMPLANT
GLOVE SURG LX 8.0 MICRO (GLOVE) ×2
GLOVE SURG LX STRL 8.0 MICRO (GLOVE) ×1 IMPLANT
GOWN STRL REUS W/ TWL LRG LVL3 (GOWN DISPOSABLE) ×2 IMPLANT
GOWN STRL REUS W/TWL LRG LVL3 (GOWN DISPOSABLE) ×4
LENS IOL TECNIS ITEC 19.5 (Intraocular Lens) ×3 IMPLANT
PACK CATARACT (MISCELLANEOUS) ×3 IMPLANT
PACK CATARACT BRASINGTON LX (MISCELLANEOUS) ×3 IMPLANT
PACK EYE AFTER SURG (MISCELLANEOUS) ×3 IMPLANT
SOL BSS BAG (MISCELLANEOUS) ×3
SOL PREP PVP 2OZ (MISCELLANEOUS) ×3
SOLUTION BSS BAG (MISCELLANEOUS) ×1 IMPLANT
SOLUTION PREP PVP 2OZ (MISCELLANEOUS) ×1 IMPLANT
SYR 3ML LL SCALE MARK (SYRINGE) ×3 IMPLANT
SYR 5ML LL (SYRINGE) ×3 IMPLANT
SYR TB 1ML 27GX1/2 LL (SYRINGE) ×3 IMPLANT
WATER STERILE IRR 250ML POUR (IV SOLUTION) ×3 IMPLANT
WIPE NON LINTING 3.25X3.25 (MISCELLANEOUS) ×3 IMPLANT

## 2016-06-04 NOTE — Anesthesia Procedure Notes (Signed)
Procedure Name: MAC Date/Time: 06/04/2016 10:35 AM Performed by: Doreen Salvage Pre-anesthesia Checklist: Patient identified, Emergency Drugs available, Suction available and Patient being monitored Patient Re-evaluated:Patient Re-evaluated prior to inductionOxygen Delivery Method: Nasal cannula

## 2016-06-04 NOTE — Op Note (Signed)
PREOPERATIVE DIAGNOSIS:  Nuclear sclerotic cataract of the left eye.   POSTOPERATIVE DIAGNOSIS:  Nuclear sclerotic cataract of the left eye.   OPERATIVE PROCEDURE: Procedure(s): CATARACT EXTRACTION PHACO AND INTRAOCULAR LENS PLACEMENT (IOC)   SURGEON:  Birder Robson, MD.   ANESTHESIA:  Anesthesiologist: Martha Clan, MD CRNA: Doreen Salvage, CRNA  1.      Managed anesthesia care. 2.     0.54ml of Shugarcaine was instilled following the paracentesis   COMPLICATIONS:  None.   TECHNIQUE:   Stop and chop   DESCRIPTION OF PROCEDURE:  The patient was examined and consented in the preoperative holding area where the aforementioned topical anesthesia was applied to the left eye and then brought back to the Operating Room where the left eye was prepped and draped in the usual sterile ophthalmic fashion and a lid speculum was placed. A paracentesis was created with the side port blade and the anterior chamber was filled with viscoelastic. A near clear corneal incision was performed with the steel keratome. A continuous curvilinear capsulorrhexis was performed with a cystotome followed by the capsulorrhexis forceps. Hydrodissection and hydrodelineation were carried out with BSS on a blunt cannula. The lens was removed in a stop and chop  technique and the remaining cortical material was removed with the irrigation-aspiration handpiece. The capsular bag was inflated with viscoelastic and the Technis ZCB00 lens was placed in the capsular bag without complication. The remaining viscoelastic was removed from the eye with the irrigation-aspiration handpiece. The wounds were hydrated. The anterior chamber was flushed with Miostat and the eye was inflated to physiologic pressure. 0.80ml Vigamox was placed in the anterior chamber. The wounds were found to be water tight. The eye was dressed with Vigamox. The patient was given protective glasses to wear throughout the day and a shield with which to sleep tonight.  The patient was also given drops with which to begin a drop regimen today and will follow-up with me in one day.  Implant Name Type Inv. Item Serial No. Manufacturer Lot No. LRB No. Used  LENS IOL DIOP 19.5 - [id number] Intraocular Lens LENS IOL DIOP 19.5 [id number] AMO   Left 1    Procedure(s) with comments: CATARACT EXTRACTION PHACO AND INTRAOCULAR LENS PLACEMENT (IOC) (Left) - Lot# [id number] H [place]: 00:40.5 AP%:20.4 CDE: 8.25  Electronically signed: [place] 06/04/2016 10:53 AM

## 2016-06-04 NOTE — H&P (Signed)
All labs reviewed. Abnormal studies sent to patients PCP when indicated.  Previous H&P reviewed, patient examined, there are NO CHANGES.  Michelle Parrish LOUIS10/17/201710:28 AM

## 2016-06-04 NOTE — Anesthesia Preprocedure Evaluation (Signed)
Anesthesia Evaluation  Patient identified by MRN, date of birth, ID band Patient awake    Reviewed: Allergy & Precautions, H&P , NPO status , Patient's Chart, lab work & pertinent test results, reviewed documented beta blocker date and time   History of Anesthesia Complications (+) PONV and history of anesthetic complications  Airway Mallampati: II  TM Distance: >3 FB Neck ROM: full    Dental no notable dental hx. (+) Teeth Intact   Pulmonary neg pulmonary ROS, neg shortness of breath, asthma ,    Pulmonary exam normal breath sounds clear to auscultation       Cardiovascular Exercise Tolerance: Good hypertension, + Peripheral Vascular Disease  negative cardio ROS  + dysrhythmias + Valvular Problems/Murmurs  Rhythm:regular Rate:Normal     Neuro/Psych  Headaches, negative neurological ROS  negative psych ROS   GI/Hepatic negative GI ROS, Neg liver ROS,   Endo/Other  negative endocrine ROSdiabetes  Renal/GU      Musculoskeletal   Abdominal   Peds  Hematology negative hematology ROS (+)   Anesthesia Other Findings   Reproductive/Obstetrics negative OB ROS                             Anesthesia Physical  Anesthesia Plan  ASA: III  Anesthesia Plan: MAC   Post-op Pain Management:    Induction:   Airway Management Planned:   Additional Equipment:   Intra-op Plan:   Post-operative Plan:   Informed Consent: I have reviewed the patients History and Physical, chart, labs and discussed the procedure including the risks, benefits and alternatives for the proposed anesthesia with the patient or authorized representative who has indicated his/her understanding and acceptance.     Plan Discussed with: CRNA  Anesthesia Plan Comments:         Anesthesia Quick Evaluation

## 2016-06-04 NOTE — Discharge Instructions (Signed)
Eye Surgery Discharge Instructions  Expect mild scratchy sensation or mild soreness. DO NOT RUB YOUR EYE!  The day of surgery:  Minimal physical activity, but bed rest is not required  No reading, computer work, or close hand work  No bending, lifting, or straining.  May watch TV  For 24 hours:  No driving, legal decisions, or alcoholic beverages  Safety precautions  Eat anything you prefer: It is better to start with liquids, then soup then solid foods.  _____ Eye patch should be worn until postoperative exam tomorrow.  ____ Solar shield eyeglasses should be worn for comfort in the sunlight/patch while sleeping  Resume all regular medications including aspirin or Coumadin if these were discontinued prior to surgery. You may shower, bathe, shave, or wash your hair. Tylenol may be taken for mild discomfort.  Call your doctor if you experience significant pain, nausea, or vomiting, fever > 101 or other signs of infection. [phone] or [phone] Specific instructions:  Follow-up Information    PORFILIO,Michelle LOUIS, MD Follow up on 06/05/2016.   Specialty:  Ophthalmology Why:  8:40 Contact information: [address] [place] [address] [phone]

## 2016-06-04 NOTE — Anesthesia Postprocedure Evaluation (Signed)
Anesthesia Post Note  Patient: BARRI PIETZ  Procedure(s) Performed: Procedure(s) (LRB): CATARACT EXTRACTION PHACO AND INTRAOCULAR LENS PLACEMENT (IOC) (Left)  Patient location during evaluation: PACU Anesthesia Type: MAC Level of consciousness: awake and alert Pain management: pain level controlled Vital Signs Assessment: post-procedure vital signs reviewed and stable Respiratory status: spontaneous breathing Cardiovascular status: blood pressure returned to baseline Postop Assessment: no backache, no headache and no signs of nausea or vomiting Anesthetic complications: no    Last Vitals:  Vitals:   06/04/16 0916 06/04/16 1054  BP: (!) 148/79 125/69  Pulse: 71 62  Resp: 18 12  Temp: 36.8 C 36.8 C    Last Pain:  Vitals:   06/04/16 1054  TempSrc: Oral  PainSc:                  Alison Stalling

## 2016-06-04 NOTE — Transfer of Care (Signed)
Immediate Anesthesia Transfer of Care Note  Patient: Michelle Parrish  Procedure(s) Performed: Procedure(s) with comments: CATARACT EXTRACTION PHACO AND INTRAOCULAR LENS PLACEMENT (IOC) (Left) - Lot# [id number] H [place]: 00:40.5 AP%:20.4 CDE: 8.25  Patient Location: PHASE II  Anesthesia Type:MAC  Level of Consciousness: Awake, Alert, Oriented  Airway & Oxygen Therapy: Patient Spontanous Breathing and Patient on room air   Post-op Assessment: Report given to RN and Post -op Vital signs reviewed and stable  Post vital signs: Reviewed and stable  Last Vitals:  Vitals:   06/04/16 0916 06/04/16 1054  BP: (!) 148/79 125/69  Pulse: 71 62  Resp: 18 12  Temp: 36.8 C [id number] C    Complications: No apparent anesthesia complications

## 2016-06-17 DIAGNOSIS — Z4431 Encounter for fitting and adjustment of external right breast prosthesis: Secondary | ICD-10-CM | POA: Diagnosis not present

## 2016-06-17 DIAGNOSIS — C50111 Malignant neoplasm of central portion of right female breast: Secondary | ICD-10-CM | POA: Diagnosis not present

## 2016-06-27 ENCOUNTER — Ambulatory Visit (INDEPENDENT_AMBULATORY_CARE_PROVIDER_SITE_OTHER): Payer: Medicare PPO | Admitting: Family Medicine

## 2016-06-27 ENCOUNTER — Encounter: Payer: Self-pay | Admitting: Family Medicine

## 2016-06-27 VITALS — BP 120/80 | HR 66 | Temp 98.2°F | Resp 16 | Wt 143.0 lb

## 2016-06-27 DIAGNOSIS — M810 Age-related osteoporosis without current pathological fracture: Secondary | ICD-10-CM | POA: Diagnosis not present

## 2016-06-27 DIAGNOSIS — I10 Essential (primary) hypertension: Secondary | ICD-10-CM

## 2016-06-27 DIAGNOSIS — E559 Vitamin D deficiency, unspecified: Secondary | ICD-10-CM | POA: Diagnosis not present

## 2016-06-27 DIAGNOSIS — J45909 Unspecified asthma, uncomplicated: Secondary | ICD-10-CM

## 2016-06-27 DIAGNOSIS — Z23 Encounter for immunization: Secondary | ICD-10-CM | POA: Diagnosis not present

## 2016-06-27 NOTE — Patient Instructions (Signed)
   Please call our office to schedule your Bone Density Test at your earliest convenience.

## 2016-06-27 NOTE — Progress Notes (Signed)
Patient: Michelle Parrish Female    DOB: Oct 27, 1940   75 y.o.   MRN: [id number] Visit Date: 06/27/2016  Today's Provider: Lelon Huh, MD   Chief Complaint  Patient presents with  . Follow-up  . 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 Is tolerating Singulair well. Uses Advair periodically and tolerating well.   Avitaminosis D From 11/23/2015-no changes were made  Her last BMD was ordered by Dr. Ammie Dalton in 2011 showing osteopenia, but she has not established with new gyn yet.  Lab Results  Component Value Date   VD25OH 56.3 11/23/2015      Hypertension, follow-up:  BP Readings from Last 3 Encounters:  06/27/16 120/80  06/04/16 130/68  05/14/16 116/71    She was last seen for hypertension 7 months ago.  BP at that visit was 130/90. Management since that visit includes; no changes.She reports good compliance with treatment. She is not having side effects. none She is exercising. She is adherent to low salt diet.   Outside blood pressures are normal. She is experiencing none.  Patient denies none.   Cardiovascular risk factors include none.  Use of agents associated with hypertension: none.   ----------------------------------------------------------------   Allergies  Allergen Reactions  . Codeine Anaphylaxis  . Ivp Dye [Iodinated Diagnostic Agents] Anaphylaxis    BETADINE OKAY  . Prednisolone Anaphylaxis  . Sulfa Antibiotics Anaphylaxis  . Colestid [Colestipol Hcl]     constipation  . Fosamax [Alendronate Sodium]     heartburn  . Lipitor [Atorvastatin]   . Lovastatin     Other reaction(s): Abdominal pain, Nausea  . Simvastatin Nausea And Vomiting  . Zetia [Ezetimibe]     Abdominal pain      Current Outpatient Prescriptions:  .  Acetaminophen (TYLENOL EXTRA STRENGTH PO), Take by mouth  as needed. Reported on 11/23/2015, Disp: , Rfl:  .  Albuterol Sulfate (PROAIR HFA IN), Inhale into the lungs. SEASONALLY FOR ASTHMA, Disp: , Rfl:  .  amLODipine (NORVASC) 5 MG tablet, Take 1 tablet (5 mg total) by mouth daily., Disp: 90 tablet, Rfl: 4 .  EPIPEN 2-PAK 0.3 MG/0.3ML SOAJ injection, INJECT INTO THIGH MUSCLE AS NEEDED FOR ANAPHYLAXIS., Disp: , Rfl: 0 .  Fluticasone-Salmeterol (ADVAIR HFA IN), Inhale into the lungs. SEASONALLY FOR ASTHMA, Disp: , Rfl:  .  lisinopril-hydrochlorothiazide (PRINZIDE,ZESTORETIC) 20-12.5 MG tablet, Take 2 tablets by mouth daily., Disp: 180 tablet, Rfl: 3 .  metoprolol succinate (TOPROL-XL) 100 MG 24 hr tablet, TAKE 1 TABLET (100 MG TOTAL) BY MOUTH DAILY., Disp: 30 tablet, Rfl: 12 .  montelukast (SINGULAIR) 10 MG tablet, Take 10 mg by mouth daily., Disp: , Rfl: 12 .  Vitamin D, Ergocalciferol, (DRISDOL) 50000 units CAPS capsule, TAKE 1 CAPSULE BY MOUTH ONCE A WEEK, Disp: 12 capsule, Rfl: 4  Review of Systems  Constitutional: Negative for appetite change, chills, fatigue and fever.  Respiratory: Negative for chest tightness and shortness of breath.   Cardiovascular: Negative for chest pain and palpitations.  Gastrointestinal: Negative for abdominal pain, nausea and vomiting.  Neurological: Negative for dizziness and weakness.    Social History  Substance Use Topics  . Smoking status: Never Smoker  . Smokeless tobacco: Never Used  . Alcohol use No   Objective:   BP 120/80 (BP Location: Left Arm, Patient Position: Sitting, Cuff Size:  Normal)   Pulse 66   Temp 98.2 F (36.8 C) (Oral)   Resp 16   Wt 143 lb (64.9 kg)   SpO2 98%   BMI 23.08 kg/m   Physical Exam   General Appearance:    Alert, cooperative, no distress  Eyes:    PERRL, conjunctiva/corneas clear, EOM's intact       Lungs:     Clear to auscultation bilaterally, respirations unlabored  Heart:    Regular rate and rhythm  Neurologic:   Awake, alert, oriented x 3. No apparent focal  neurological           defect.            Assessment & Plan:     1. 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 Benign hypertension Well controlled.  Continue current medications.    3. Vitamin D deficiency   4. Osteoporosis, unspecified osteoporosis type, unspecified pathological fracture presence Advised she is due for BMD. She would like to do after the Holidays are over and will return or call back when ready to schedule.   5. Need for influenza vaccination  - Flu vaccine HIGH DOSE PF       Lelon Huh, MD  [facility]

## 2016-07-05 DIAGNOSIS — Z961 Presence of intraocular lens: Secondary | ICD-10-CM | POA: Diagnosis not present

## 2016-08-15 ENCOUNTER — Telehealth: Payer: Self-pay | Admitting: Family Medicine

## 2016-08-15 DIAGNOSIS — E2839 Other primary ovarian failure: Secondary | ICD-10-CM

## 2016-08-15 NOTE — Telephone Encounter (Signed)
Pt would like to schedule her Bone Scan , please reach out to her - knb

## 2016-08-15 NOTE — Telephone Encounter (Signed)
Last ov 06/27/16, last BMD done on 02/20/10. Please review. Thank you. sd

## 2016-08-15 NOTE — Telephone Encounter (Signed)
Pt scheduled for bone density at [facility] 09/24/16 at 11:00

## 2016-08-30 DIAGNOSIS — I10 Essential (primary) hypertension: Secondary | ICD-10-CM | POA: Diagnosis not present

## 2016-08-30 DIAGNOSIS — I498 Other specified cardiac arrhythmias: Secondary | ICD-10-CM | POA: Diagnosis not present

## 2016-08-30 DIAGNOSIS — R011 Cardiac murmur, unspecified: Secondary | ICD-10-CM | POA: Diagnosis not present

## 2016-09-09 DIAGNOSIS — D485 Neoplasm of uncertain behavior of skin: Secondary | ICD-10-CM | POA: Diagnosis not present

## 2016-09-09 DIAGNOSIS — D225 Melanocytic nevi of trunk: Secondary | ICD-10-CM | POA: Diagnosis not present

## 2016-09-09 DIAGNOSIS — D2272 Melanocytic nevi of left lower limb, including hip: Secondary | ICD-10-CM | POA: Diagnosis not present

## 2016-09-09 DIAGNOSIS — D2261 Melanocytic nevi of right upper limb, including shoulder: Secondary | ICD-10-CM | POA: Diagnosis not present

## 2016-09-09 DIAGNOSIS — Z85828 Personal history of other malignant neoplasm of skin: Secondary | ICD-10-CM | POA: Diagnosis not present

## 2016-09-09 DIAGNOSIS — D0462 Carcinoma in situ of skin of left upper limb, including shoulder: Secondary | ICD-10-CM | POA: Diagnosis not present

## 2016-09-17 ENCOUNTER — Other Ambulatory Visit: Payer: Self-pay | Admitting: Family Medicine

## 2016-09-17 DIAGNOSIS — Z1231 Encounter for screening mammogram for malignant neoplasm of breast: Secondary | ICD-10-CM

## 2016-09-24 ENCOUNTER — Ambulatory Visit
Admission: RE | Admit: 2016-09-24 | Discharge: 2016-09-24 | Disposition: A | Discharge status: Home / Self Care | Payer: Medicare PPO | Source: Ambulatory Visit | Attending: Family Medicine | Admitting: Family Medicine

## 2016-09-24 ENCOUNTER — Telehealth: Payer: Self-pay

## 2016-09-24 ENCOUNTER — Encounter: Payer: Self-pay | Admitting: Family Medicine

## 2016-09-24 DIAGNOSIS — M810 Age-related osteoporosis without current pathological fracture: Secondary | ICD-10-CM | POA: Diagnosis not present

## 2016-09-24 DIAGNOSIS — E2839 Other primary ovarian failure: Secondary | ICD-10-CM | POA: Insufficient documentation

## 2016-09-24 MED ORDER — RISEDRONATE SODIUM 35 MG PO TABS: 35.0000 mg | ORAL_TABLET | ORAL | 4 refills | Status: DC

## 2016-09-24 NOTE — Telephone Encounter (Signed)
Pt advised. Medication sent in. Renaldo Fiddler, CMA

## 2016-09-24 NOTE — Telephone Encounter (Signed)
-----   Message from Birdie Sons, MD sent at 09/24/2016 12:30 PM EST ----- BMD shows osteoporosis. Need to start Actonel 35mg  once a week, #12, rf x 4. Call if any adverse effects.

## 2016-09-26 ENCOUNTER — Ambulatory Visit
Admission: RE | Admit: 2016-09-26 | Discharge: 2016-09-26 | Disposition: A | Discharge status: Home / Self Care | Payer: Medicare PPO | Source: Ambulatory Visit | Attending: Family Medicine | Admitting: Family Medicine

## 2016-09-26 DIAGNOSIS — Z1231 Encounter for screening mammogram for malignant neoplasm of breast: Secondary | ICD-10-CM | POA: Insufficient documentation

## 2016-09-26 HISTORY — DX: Malignant neoplasm of unspecified site of unspecified female breast: C50.919

## 2016-10-03 DIAGNOSIS — Z8619 Personal history of other infectious and parasitic diseases: Secondary | ICD-10-CM | POA: Diagnosis not present

## 2016-10-03 DIAGNOSIS — R300 Dysuria: Secondary | ICD-10-CM | POA: Diagnosis not present

## 2016-10-03 DIAGNOSIS — Z01411 Encounter for gynecological examination (general) (routine) with abnormal findings: Secondary | ICD-10-CM | POA: Diagnosis not present

## 2016-10-03 DIAGNOSIS — Z01419 Encounter for gynecological examination (general) (routine) without abnormal findings: Secondary | ICD-10-CM | POA: Diagnosis not present

## 2016-10-03 DIAGNOSIS — Z9011 Acquired absence of right breast and nipple: Secondary | ICD-10-CM | POA: Diagnosis not present

## 2016-10-03 DIAGNOSIS — Z124 Encounter for screening for malignant neoplasm of cervix: Secondary | ICD-10-CM | POA: Diagnosis not present

## 2016-10-03 DIAGNOSIS — Z853 Personal history of malignant neoplasm of breast: Secondary | ICD-10-CM | POA: Diagnosis not present

## 2016-10-11 DIAGNOSIS — D0462 Carcinoma in situ of skin of left upper limb, including shoulder: Secondary | ICD-10-CM | POA: Diagnosis not present

## 2016-10-31 DIAGNOSIS — E236 Other disorders of pituitary gland: Secondary | ICD-10-CM | POA: Diagnosis not present

## 2016-11-06 ENCOUNTER — Other Ambulatory Visit: Payer: Self-pay | Admitting: Neurology

## 2016-11-06 DIAGNOSIS — E236 Other disorders of pituitary gland: Secondary | ICD-10-CM

## 2016-11-19 ENCOUNTER — Ambulatory Visit: Payer: No Typology Code available for payment source

## 2016-11-24 ENCOUNTER — Other Ambulatory Visit: Payer: Self-pay | Admitting: Family Medicine

## 2016-11-24 DIAGNOSIS — I10 Essential (primary) hypertension: Secondary | ICD-10-CM

## 2016-11-26 ENCOUNTER — Ambulatory Visit
Admission: RE | Admit: 2016-11-26 | Discharge: 2016-11-26 | Disposition: A | Discharge status: Home / Self Care | Payer: Medicare PPO | Source: Ambulatory Visit | Attending: Neurology | Admitting: Neurology

## 2016-11-26 DIAGNOSIS — G9389 Other specified disorders of brain: Secondary | ICD-10-CM | POA: Diagnosis not present

## 2016-11-26 DIAGNOSIS — E236 Other disorders of pituitary gland: Secondary | ICD-10-CM | POA: Diagnosis not present

## 2016-11-26 LAB — POCT I-STAT CREATININE: Creatinine, Ser: 0.6 mg/dL (ref 0.44–1.00)

## 2016-11-26 MED ORDER — GADOBENATE DIMEGLUMINE 529 MG/ML IV SOLN
10.0000 mL | Freq: Once | INTRAVENOUS | Status: AC | PRN
  Administered 2016-11-26: 10 mL via INTRAVENOUS

## 2016-12-04 ENCOUNTER — Encounter: Payer: Self-pay | Admitting: Family Medicine

## 2016-12-04 ENCOUNTER — Ambulatory Visit (INDEPENDENT_AMBULATORY_CARE_PROVIDER_SITE_OTHER): Payer: Medicare PPO | Admitting: Family Medicine

## 2016-12-04 VITALS — BP 120/76 | HR 80 | Temp 98.1°F | Resp 16 | Wt 137.0 lb

## 2016-12-04 DIAGNOSIS — M810 Age-related osteoporosis without current pathological fracture: Secondary | ICD-10-CM | POA: Diagnosis not present

## 2016-12-04 DIAGNOSIS — I10 Essential (primary) hypertension: Secondary | ICD-10-CM

## 2016-12-04 DIAGNOSIS — M791 Myalgia, unspecified site: Secondary | ICD-10-CM

## 2016-12-04 DIAGNOSIS — J45909 Unspecified asthma, uncomplicated: Secondary | ICD-10-CM

## 2016-12-04 MED ORDER — FLUTICASONE-SALMETEROL 100-50 MCG/DOSE IN AEPB: 1.0000 | INHALATION_SPRAY | Freq: Two times a day (BID) | RESPIRATORY_TRACT | 3 refills | Status: DC

## 2016-12-04 MED ORDER — ALBUTEROL SULFATE HFA 108 (90 BASE) MCG/ACT IN AERS: 2.0000 | INHALATION_SPRAY | Freq: Four times a day (QID) | RESPIRATORY_TRACT | 3 refills | Status: DC | PRN

## 2016-12-04 NOTE — Patient Instructions (Signed)

## 2016-12-04 NOTE — Progress Notes (Signed)
Patient: Michelle Parrish Female    DOB: 1940/12/20   76 y.o.   MRN: [id number] Visit Date: 12/04/2016  Today's Provider: Lelon Huh, MD   Chief Complaint  Patient presents with  . Hypertension    follow up  . Asthma    follow up   Subjective:    HPI  Hypertension, follow-up:  BP Readings from Last 3 Encounters:  06/27/16 120/80  06/04/16 130/68  05/14/16 116/71    She was last seen for hypertension 5 months ago.  BP at that visit was 120/80. Management since that visit includes no changes. She reports good compliance with treatment. She is not having side effects.  She is exercising. She is adherent to low salt diet.   Outside blood pressures are 118/78. She is experiencing dyspnea and lower extremity edema.  Patient denies chest pain, claudication, exertional chest pressure/discomfort, irregular heart beat, near-syncope, orthopnea, palpitations, paroxysmal nocturnal dyspnea, syncope and tachypnea.   Cardiovascular risk factors include advanced age (older than 59 for men, 64 for women) and hypertension.  Use of agents associated with hypertension: none.     Weight trend: decreasing steadily Wt Readings from Last 3 Encounters:  06/27/16 143 lb (64.9 kg)  06/04/16 144 lb (65.3 kg)  05/14/16 140 lb (63.5 kg)    Current diet: well balanced  ------------------------------------------------------------------------ Follow up of Asthma:  Patient was last seen for this problem 5 months ago and no changes were made. Patient reports good compliance with treatment, good tolerance and good symptom control. Has been using Advair and Singular through the allergy symptoms.   Follow up of Vitamin D Deficiency:  Patient was last seen for this problem 5 months ago and no changes were made. Patient reports good compliance with treatment and good tolerance. Lab Results  Component Value Date   VD25OH 56.3 11/23/2015   Follow up osteoporosis.      BMD in February with  t=-2.5 right hip. Patient was prescribed but did not start Actonel that was prescribed in February due to concern about potential side effects. She took Fosamax years ago with no adverse effects, but stopped due to concern she might develop side effects.   Complains of onset of shoulder and leg pains since January and aggravated by cold weather. Saw Dr. Manuella Ghazi and was prescribed muscle relaxer which didn't help and gabapentin which didn't help. Is worse in the morning and takes a few hours to loosen up. States that taking a couple of Aleve or Tylenol will help.     Allergies  Allergen Reactions  . Codeine Anaphylaxis  . Ivp Dye [Iodinated Diagnostic Agents] Anaphylaxis    BETADINE OKAY  . Prednisolone Anaphylaxis  . Sulfa Antibiotics Anaphylaxis  . Colestid [Colestipol Hcl]     constipation  . Fosamax [Alendronate Sodium]     heartburn  . Lipitor [Atorvastatin]   . Lovastatin     Other reaction(s): Abdominal pain, Nausea  . Simvastatin Nausea And Vomiting  . Zetia [Ezetimibe]     Abdominal pain      Current Outpatient Prescriptions:  .  Acetaminophen (TYLENOL EXTRA STRENGTH PO), Take by mouth as needed. Reported on 11/23/2015, Disp: , Rfl:  .  Albuterol Sulfate (PROAIR HFA IN), Inhale into the lungs. SEASONALLY FOR ASTHMA, Disp: , Rfl:  .  amLODipine (NORVASC) 5 MG tablet, Take 1 tablet (5 mg total) by mouth daily., Disp: 90 tablet, Rfl: 4 .  baclofen (LIORESAL) 10 MG tablet, Take 10 mg by mouth  2 (two) times daily., Disp: , Rfl:  .  EPIPEN 2-PAK 0.3 MG/0.3ML SOAJ injection, INJECT INTO THIGH MUSCLE AS NEEDED FOR ANAPHYLAXIS., Disp: , Rfl: 0 .  Fluticasone-Salmeterol (ADVAIR HFA IN), Inhale into the lungs. SEASONALLY FOR ASTHMA, Disp: , Rfl:  .  gabapentin (NEURONTIN) 100 MG capsule, Take 2 capsules by mouth at bedtime., Disp: , Rfl:  .  lisinopril-hydrochlorothiazide (PRINZIDE,ZESTORETIC) 20-12.5 MG tablet, TAKE 2 TABLETS BY MOUTH DAILY., Disp: 180 tablet, Rfl: 4 .  metoprolol  succinate (TOPROL-XL) 100 MG 24 hr tablet, TAKE 1 TABLET (100 MG TOTAL) BY MOUTH DAILY., Disp: 30 tablet, Rfl: 12 .  montelukast (SINGULAIR) 10 MG tablet, Take 10 mg by mouth daily., Disp: , Rfl: 12 .  Multiple Vitamin (MULTIVITAMIN) tablet, Take 1 tablet by mouth daily., Disp: , Rfl:  .  Vitamin D, Ergocalciferol, (DRISDOL) 50000 units CAPS capsule, TAKE 1 CAPSULE BY MOUTH ONCE A WEEK, Disp: 12 capsule, Rfl: 4 .  risedronate (ACTONEL) 35 MG tablet, Take 1 tablet (35 mg total) by mouth every 7 (seven) days. with water on empty stomach, nothing by mouth or lie down for next 30 minutes. (Patient not taking: Reported on 12/04/2016), Disp: 12 tablet, Rfl: 4  Review of Systems  Constitutional: Negative for appetite change, chills, fatigue and fever.  Respiratory: Positive for shortness of breath. Negative for chest tightness.   Cardiovascular: Positive for leg swelling. Negative for chest pain and palpitations.  Gastrointestinal: Negative for abdominal pain, nausea and vomiting.  Musculoskeletal: Positive for arthralgias and myalgias.  Neurological: Negative for dizziness and weakness.    Social History  Substance Use Topics  . Smoking status: Never Smoker  . Smokeless tobacco: Never Used  . Alcohol use No   Objective:   BP 120/76 (BP Location: Left Arm, Patient Position: Sitting, Cuff Size: Normal)   Pulse 80   Temp 98.1 F (36.7 C) (Oral)   Resp 16   Wt 137 lb (62.1 kg)   SpO2 98% Comment: room air  BMI 22.11 kg/m  There were no vitals filed for this visit.   Physical Exam  General appearance: alert, well developed, well nourished, cooperative and in no distress Head: Normocephalic, without obvious abnormality, atraumatic Respiratory: Respirations even and unlabored, normal respiratory rate MS: Mild diffuse muscle tenderness of shoulders and legs.      Assessment & Plan:     1. Myalgia Aggravated by cold weather.  - Sed Rate (ESR) - ANA w/Reflex if Positive  2.  Osteoporosis, unspecified osteoporosis type, unspecified pathological fracture presence Patient refuses bisphophonate at this time due to potential side effects.   3. 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   4. Benign hypertension Well controlled.  Continue current medications.         Lelon Huh, MD  [facility]

## 2016-12-05 LAB — ANA W/REFLEX IF POSITIVE: ANA: NEGATIVE

## 2016-12-05 LAB — SEDIMENTATION RATE: SED RATE: 10 mm/h (ref 0–40)

## 2016-12-06 ENCOUNTER — Telehealth: Payer: Self-pay

## 2016-12-06 DIAGNOSIS — M791 Myalgia, unspecified site: Secondary | ICD-10-CM

## 2016-12-06 NOTE — Telephone Encounter (Signed)
Advised patient of results. Patient wants to go ahead with the rheumatology referral. She reports that she has a sister with fibromyalgia and her mother was diagnosed with polymyalgia rheumatica when she was alive.   Patient also wanted to know if we should check for RMSF? She has not had a tick bite that she is aware of, but thought she would mention it to you.

## 2016-12-06 NOTE — Telephone Encounter (Signed)
-----   Message from Birdie Sons, MD sent at 12/06/2016  1:55 PM EDT ----- Labs are normal. She may have a mild case of fibromyalgia. I would riding it out until the weather stays warmer. If continues to have muscle and joint pains into the summer we can refer her to a rheumatologist.

## 2016-12-09 ENCOUNTER — Other Ambulatory Visit: Payer: Self-pay | Admitting: Family Medicine

## 2016-12-09 NOTE — Telephone Encounter (Signed)
[facility] pharmacy faxed a request for the following medication. Thanks CC  montelukast (SINGULAIR) 10 MG tablet

## 2016-12-10 MED ORDER — MONTELUKAST SODIUM 10 MG PO TABS: 10.0000 mg | ORAL_TABLET | Freq: Every day | ORAL | 6 refills | Status: DC

## 2016-12-16 DIAGNOSIS — I10 Essential (primary) hypertension: Secondary | ICD-10-CM | POA: Diagnosis not present

## 2016-12-16 DIAGNOSIS — M791 Myalgia: Secondary | ICD-10-CM | POA: Diagnosis not present

## 2016-12-16 DIAGNOSIS — E236 Other disorders of pituitary gland: Secondary | ICD-10-CM | POA: Diagnosis not present

## 2016-12-24 DIAGNOSIS — E236 Other disorders of pituitary gland: Secondary | ICD-10-CM | POA: Diagnosis not present

## 2016-12-24 DIAGNOSIS — M818 Other osteoporosis without current pathological fracture: Secondary | ICD-10-CM | POA: Diagnosis not present

## 2016-12-24 DIAGNOSIS — R7982 Elevated C-reactive protein (CRP): Secondary | ICD-10-CM | POA: Diagnosis not present

## 2016-12-24 DIAGNOSIS — M791 Myalgia: Secondary | ICD-10-CM | POA: Diagnosis not present

## 2017-01-01 DIAGNOSIS — M3501 Sicca syndrome with keratoconjunctivitis: Secondary | ICD-10-CM | POA: Diagnosis not present

## 2017-01-03 DIAGNOSIS — R251 Tremor, unspecified: Secondary | ICD-10-CM | POA: Diagnosis not present

## 2017-01-03 DIAGNOSIS — G4700 Insomnia, unspecified: Secondary | ICD-10-CM | POA: Diagnosis not present

## 2017-01-03 DIAGNOSIS — M791 Myalgia: Secondary | ICD-10-CM | POA: Diagnosis not present

## 2017-01-03 DIAGNOSIS — Z8659 Personal history of other mental and behavioral disorders: Secondary | ICD-10-CM | POA: Diagnosis not present

## 2017-01-03 DIAGNOSIS — R42 Dizziness and giddiness: Secondary | ICD-10-CM | POA: Diagnosis not present

## 2017-01-03 DIAGNOSIS — E236 Other disorders of pituitary gland: Secondary | ICD-10-CM | POA: Diagnosis not present

## 2017-01-07 DIAGNOSIS — M353 Polymyalgia rheumatica: Secondary | ICD-10-CM | POA: Diagnosis not present

## 2017-01-07 DIAGNOSIS — R7982 Elevated C-reactive protein (CRP): Secondary | ICD-10-CM | POA: Diagnosis not present

## 2017-01-07 DIAGNOSIS — E236 Other disorders of pituitary gland: Secondary | ICD-10-CM | POA: Diagnosis not present

## 2017-01-07 DIAGNOSIS — M818 Other osteoporosis without current pathological fracture: Secondary | ICD-10-CM | POA: Diagnosis not present

## 2017-01-07 DIAGNOSIS — Z79899 Other long term (current) drug therapy: Secondary | ICD-10-CM | POA: Diagnosis not present

## 2017-01-10 DIAGNOSIS — Z79899 Other long term (current) drug therapy: Secondary | ICD-10-CM | POA: Diagnosis not present

## 2017-01-10 DIAGNOSIS — M353 Polymyalgia rheumatica: Secondary | ICD-10-CM | POA: Diagnosis not present

## 2017-01-10 DIAGNOSIS — L508 Other urticaria: Secondary | ICD-10-CM | POA: Diagnosis not present

## 2017-01-10 DIAGNOSIS — L509 Urticaria, unspecified: Secondary | ICD-10-CM | POA: Diagnosis not present

## 2017-01-21 DIAGNOSIS — Z79899 Other long term (current) drug therapy: Secondary | ICD-10-CM | POA: Diagnosis not present

## 2017-01-21 DIAGNOSIS — R7982 Elevated C-reactive protein (CRP): Secondary | ICD-10-CM | POA: Diagnosis not present

## 2017-01-21 DIAGNOSIS — M353 Polymyalgia rheumatica: Secondary | ICD-10-CM | POA: Diagnosis not present

## 2017-01-23 ENCOUNTER — Other Ambulatory Visit: Payer: Self-pay | Admitting: Family Medicine

## 2017-01-25 ENCOUNTER — Other Ambulatory Visit: Payer: Self-pay | Admitting: Family Medicine

## 2017-01-25 DIAGNOSIS — I10 Essential (primary) hypertension: Secondary | ICD-10-CM

## 2017-02-11 ENCOUNTER — Encounter: Payer: Self-pay | Admitting: *Deleted

## 2017-02-17 DIAGNOSIS — Z79899 Other long term (current) drug therapy: Secondary | ICD-10-CM | POA: Diagnosis not present

## 2017-02-17 DIAGNOSIS — M353 Polymyalgia rheumatica: Secondary | ICD-10-CM | POA: Diagnosis not present

## 2017-02-18 ENCOUNTER — Ambulatory Visit: Payer: Self-pay | Admitting: General Surgery

## 2017-03-05 DIAGNOSIS — D225 Melanocytic nevi of trunk: Secondary | ICD-10-CM | POA: Diagnosis not present

## 2017-03-05 DIAGNOSIS — X32XXXA Exposure to sunlight, initial encounter: Secondary | ICD-10-CM | POA: Diagnosis not present

## 2017-03-05 DIAGNOSIS — D2272 Melanocytic nevi of left lower limb, including hip: Secondary | ICD-10-CM | POA: Diagnosis not present

## 2017-03-05 DIAGNOSIS — Z85828 Personal history of other malignant neoplasm of skin: Secondary | ICD-10-CM | POA: Diagnosis not present

## 2017-03-05 DIAGNOSIS — L570 Actinic keratosis: Secondary | ICD-10-CM | POA: Diagnosis not present

## 2017-03-05 DIAGNOSIS — L501 Idiopathic urticaria: Secondary | ICD-10-CM | POA: Diagnosis not present

## 2017-03-06 ENCOUNTER — Other Ambulatory Visit: Payer: Self-pay | Admitting: Family Medicine

## 2017-04-16 DIAGNOSIS — Z79899 Other long term (current) drug therapy: Secondary | ICD-10-CM | POA: Diagnosis not present

## 2017-04-16 DIAGNOSIS — M353 Polymyalgia rheumatica: Secondary | ICD-10-CM | POA: Diagnosis not present

## 2017-04-23 DIAGNOSIS — Z961 Presence of intraocular lens: Secondary | ICD-10-CM | POA: Diagnosis not present

## 2017-04-24 DIAGNOSIS — M353 Polymyalgia rheumatica: Secondary | ICD-10-CM | POA: Diagnosis not present

## 2017-04-24 DIAGNOSIS — Z79899 Other long term (current) drug therapy: Secondary | ICD-10-CM | POA: Diagnosis not present

## 2017-05-22 DIAGNOSIS — M353 Polymyalgia rheumatica: Secondary | ICD-10-CM | POA: Diagnosis not present

## 2017-05-22 DIAGNOSIS — Z79899 Other long term (current) drug therapy: Secondary | ICD-10-CM | POA: Diagnosis not present

## 2017-05-26 ENCOUNTER — Encounter: Payer: Self-pay | Admitting: Family Medicine

## 2017-05-26 ENCOUNTER — Ambulatory Visit (INDEPENDENT_AMBULATORY_CARE_PROVIDER_SITE_OTHER): Payer: Medicare PPO | Admitting: Family Medicine

## 2017-05-26 VITALS — BP 108/68 | HR 69 | Temp 98.3°F | Resp 16 | Wt 144.0 lb

## 2017-05-26 DIAGNOSIS — M353 Polymyalgia rheumatica: Secondary | ICD-10-CM

## 2017-05-26 DIAGNOSIS — I10 Essential (primary) hypertension: Secondary | ICD-10-CM

## 2017-05-26 DIAGNOSIS — Z23 Encounter for immunization: Secondary | ICD-10-CM

## 2017-05-26 DIAGNOSIS — Z91018 Allergy to other foods: Secondary | ICD-10-CM

## 2017-05-26 MED ORDER — LISINOPRIL-HYDROCHLOROTHIAZIDE 20-12.5 MG PO TABS: 1.0000 | ORAL_TABLET | Freq: Every day | ORAL | 0 refills | Status: DC

## 2017-05-26 NOTE — Progress Notes (Signed)
Patient: Michelle Parrish Female    DOB: 1941-06-03   76 y.o.   MRN: [id number] Visit Date: 05/26/2017  Today's Provider: Lelon Huh, MD   Chief Complaint  Patient presents with  . Hypertension    follow up  . Asthma    follow up  . Osteoporosis    follow up   Subjective:    HPI  Hypertension, follow-up:  BP Readings from Last 3 Encounters:  12/04/16 120/76  06/27/16 120/80  06/04/16 130/68    She was last seen for hypertension 6 months ago.  BP at that visit was 120/76. Management since that visit includes no changes. She reports good compliance with treatment. She is not having side effects.  She is exercising. She is adherent to low salt diet.   Outside blood pressures are 108/68 this morning at home. She is experiencing dyspnea and lower extremity edema.  Patient denies chest pain, chest pressure/discomfort, claudication, exertional chest pressure/discomfort, fatigue, irregular heart beat, near-syncope, orthopnea, palpitations, paroxysmal nocturnal dyspnea, syncope and tachypnea.   Cardiovascular risk factors include advanced age (older than 67 for men, 91 for women) and hypertension.  Use of agents associated with hypertension: none.     Weight trend: increasing steadily Wt Readings from Last 3 Encounters:  12/04/16 137 lb (62.1 kg)  06/27/16 143 lb (64.9 kg)  06/04/16 144 lb (65.3 kg)    Current diet: in general, a "healthy" diet    ------------------------------------------------------------------------ Follow up of Asthma:  Patient was last seen for this problem 6 months ago and no changes were made. Patient reports this problem is stable.   Follow up of Osteoporosis:  Patient was last seen for this problem 6 months ago. Since that she has started Actonel which she is tolerating well. She is alos on long term prednisone therapy for PMR per Dr. Barb Merino.     Allergies  Allergen Reactions  . Codeine Anaphylaxis  . Ivp Dye [Iodinated Diagnostic  Agents] Anaphylaxis    BETADINE OKAY  . Prednisolone Anaphylaxis  . Sulfa Antibiotics Anaphylaxis  . Colestid [Colestipol Hcl]     constipation  . Fosamax [Alendronate Sodium]     heartburn  . Lipitor [Atorvastatin]   . Lovastatin     Other reaction(s): Abdominal pain, Nausea  . Simvastatin Nausea And Vomiting  . Zetia [Ezetimibe]     Abdominal pain      Current Outpatient Prescriptions:  .  Acetaminophen (TYLENOL EXTRA STRENGTH PO), Take by mouth as needed. Reported on 11/23/2015, Disp: , Rfl:  .  albuterol (PROAIR HFA) 108 (90 Base) MCG/ACT inhaler, Inhale 2 puffs into the lungs every 6 (six) hours as needed for wheezing or shortness of breath. SEASONALLY FOR ASTHMA, Disp: 1 Inhaler, Rfl: 3 .  amLODipine (NORVASC) 5 MG tablet, TAKE 1 TABLET (5 MG TOTAL) BY MOUTH DAILY., Disp: 90 tablet, Rfl: 4 .  EPIPEN 2-PAK 0.3 MG/0.3ML SOAJ injection, INJECT INTO THIGH MUSCLE AS NEEDED FOR ANAPHYLAXIS., Disp: , Rfl: 0 .  Fluticasone-Salmeterol (ADVAIR) 100-50 MCG/DOSE AEPB, Inhale 1 puff into the lungs 2 (two) times daily., Disp: 1 each, Rfl: 3 .  gabapentin (NEURONTIN) 100 MG capsule, Take 2 capsules by mouth Nightly., Disp: , Rfl:  .  lisinopril-hydrochlorothiazide (PRINZIDE,ZESTORETIC) 20-12.5 MG tablet, TAKE 2 TABLETS BY MOUTH DAILY., Disp: 180 tablet, Rfl: 4 .  metoprolol succinate (TOPROL-XL) 100 MG 24 hr tablet, TAKE 1 TABLET (100 MG TOTAL) BY MOUTH DAILY., Disp: 90 tablet, Rfl: 4 .  montelukast (SINGULAIR) 10  MG tablet, Take 1 tablet (10 mg total) by mouth daily., Disp: 30 tablet, Rfl: 6 .  Multiple Vitamin (MULTIVITAMIN) tablet, Take 1 tablet by mouth daily., Disp: , Rfl:  .  risedronate (ACTONEL) 35 MG tablet, Take 1 tablet (35 mg total) by mouth every 7 (seven) days. with water on empty stomach, nothing by mouth or lie down for next 30 minutes., Disp: 12 tablet, Rfl: 4 .  Vitamin D, Ergocalciferol, (DRISDOL) 50000 units CAPS capsule, TAKE 1 CAPSULE BY MOUTH ONCE A WEEK, Disp: 12 capsule,  Rfl: 4 .  predniSONE (DELTASONE) 5 MG tablet, Take 2 tablets by mouth daily., Disp: , Rfl: 1  Review of Systems  Constitutional: Negative for appetite change, chills, fatigue and fever.  Respiratory: Positive for shortness of breath (occasionally due to Asthma; unchanged from baseline). Negative for chest tightness.   Cardiovascular: Negative for chest pain and palpitations.  Gastrointestinal: Negative for abdominal pain, nausea and vomiting.  Neurological: Negative for dizziness and weakness.    Social History  Substance Use Topics  . Smoking status: Never Smoker  . Smokeless tobacco: Never Used  . Alcohol use No   Objective:   BP 108/68 (BP Location: Left Arm, Patient Position: Sitting, Cuff Size: Large)   Pulse 69   Temp 98.3 F (36.8 C) (Oral)   Resp 16   Wt 144 lb (65.3 kg)   SpO2 96% Comment: room air  BMI 23.24 kg/m  There were no vitals filed for this visit.   Physical Exam   General Appearance:    Alert, cooperative, no distress  Eyes:    PERRL, conjunctiva/corneas clear, EOM's intact       Lungs:     Clear to auscultation bilaterally, respirations unlabored  Heart:    Regular rate and rhythm  Neurologic:   Awake, alert, oriented x 3. No apparent focal neurological           defect.           Assessment & Plan:     1. Benign hypertension Reduce lisinopril-hctz to 1 a day. Call if BP consistently >140/90   2. PMR (polymyalgia rheumatica) (HCC) Continue prednisone and regular follow up with rhheumatology  3. Allergy to meat   4. Need for influenza vaccination  - Flu vaccine HIGH DOSE PF (Fluzone High dose)  Return in about 6 months (around 11/24/2017).         Lelon Huh, MD  [facility]

## 2017-06-06 DIAGNOSIS — R011 Cardiac murmur, unspecified: Secondary | ICD-10-CM | POA: Diagnosis not present

## 2017-06-06 DIAGNOSIS — I498 Other specified cardiac arrhythmias: Secondary | ICD-10-CM | POA: Diagnosis not present

## 2017-06-06 DIAGNOSIS — I10 Essential (primary) hypertension: Secondary | ICD-10-CM | POA: Diagnosis not present

## 2017-06-13 ENCOUNTER — Other Ambulatory Visit: Payer: Self-pay | Admitting: Family Medicine

## 2017-06-18 ENCOUNTER — Ambulatory Visit: Payer: Self-pay | Admitting: General Surgery

## 2017-06-19 DIAGNOSIS — Z79899 Other long term (current) drug therapy: Secondary | ICD-10-CM | POA: Diagnosis not present

## 2017-06-19 DIAGNOSIS — M353 Polymyalgia rheumatica: Secondary | ICD-10-CM | POA: Diagnosis not present

## 2017-07-24 DIAGNOSIS — Z79899 Other long term (current) drug therapy: Secondary | ICD-10-CM | POA: Diagnosis not present

## 2017-07-24 DIAGNOSIS — M353 Polymyalgia rheumatica: Secondary | ICD-10-CM | POA: Diagnosis not present

## 2017-09-04 DIAGNOSIS — M353 Polymyalgia rheumatica: Secondary | ICD-10-CM | POA: Diagnosis not present

## 2017-09-04 DIAGNOSIS — Z79899 Other long term (current) drug therapy: Secondary | ICD-10-CM | POA: Diagnosis not present

## 2017-10-22 DIAGNOSIS — M791 Myalgia, unspecified site: Secondary | ICD-10-CM | POA: Diagnosis not present

## 2017-10-22 DIAGNOSIS — M353 Polymyalgia rheumatica: Secondary | ICD-10-CM | POA: Diagnosis not present

## 2017-10-22 DIAGNOSIS — Z79899 Other long term (current) drug therapy: Secondary | ICD-10-CM | POA: Diagnosis not present

## 2017-10-22 DIAGNOSIS — M818 Other osteoporosis without current pathological fracture: Secondary | ICD-10-CM | POA: Diagnosis not present

## 2017-10-22 DIAGNOSIS — R7982 Elevated C-reactive protein (CRP): Secondary | ICD-10-CM | POA: Diagnosis not present

## 2017-10-28 ENCOUNTER — Telehealth: Payer: Self-pay

## 2017-10-28 NOTE — Telephone Encounter (Signed)
Called pt to schedule AWV and pt states she is NOT interested in scheduling an AWV. Pt only wants to see her PCP. FYI!  -MM

## 2017-11-10 ENCOUNTER — Ambulatory Visit (INDEPENDENT_AMBULATORY_CARE_PROVIDER_SITE_OTHER): Payer: Medicare PPO | Admitting: Family Medicine

## 2017-11-10 ENCOUNTER — Encounter: Payer: Self-pay | Admitting: Family Medicine

## 2017-11-10 VITALS — BP 140/80 | HR 78 | Temp 98.7°F | Resp 16 | Wt 147.0 lb

## 2017-11-10 DIAGNOSIS — M353 Polymyalgia rheumatica: Secondary | ICD-10-CM

## 2017-11-10 DIAGNOSIS — J45909 Unspecified asthma, uncomplicated: Secondary | ICD-10-CM

## 2017-11-10 DIAGNOSIS — E559 Vitamin D deficiency, unspecified: Secondary | ICD-10-CM

## 2017-11-10 DIAGNOSIS — E785 Hyperlipidemia, unspecified: Secondary | ICD-10-CM

## 2017-11-10 DIAGNOSIS — J329 Chronic sinusitis, unspecified: Secondary | ICD-10-CM

## 2017-11-10 DIAGNOSIS — I10 Essential (primary) hypertension: Secondary | ICD-10-CM | POA: Diagnosis not present

## 2017-11-10 MED ORDER — FLUTICASONE-SALMETEROL 100-50 MCG/DOSE IN AEPB: 1.0000 | INHALATION_SPRAY | Freq: Two times a day (BID) | RESPIRATORY_TRACT | 3 refills | Status: DC

## 2017-11-10 MED ORDER — AZITHROMYCIN 250 MG PO TABS: ORAL_TABLET | ORAL | 0 refills | Status: AC

## 2017-11-10 MED ORDER — ALBUTEROL SULFATE HFA 108 (90 BASE) MCG/ACT IN AERS: 2.0000 | INHALATION_SPRAY | Freq: Four times a day (QID) | RESPIRATORY_TRACT | 3 refills | Status: DC | PRN

## 2017-11-10 NOTE — Progress Notes (Signed)
Patient: Michelle Parrish Female    DOB: 01-25-1941   77 y.o.   MRN: [id number] Visit Date: 11/10/2017  Today's Provider: Lelon Huh, MD   Chief Complaint  Patient presents with  . URI    x 5 days   Subjective:    URI   This is a new problem. Episode onset: 5 days ago. The problem has been unchanged. Maximum temperature: low grade100.0 4 days ago. Associated symptoms include congestion, coughing (productive with yellow sputum), ear pain (right ear), headaches, rhinorrhea, sneezing, a sore throat and wheezing. Pertinent negatives include no abdominal pain, chest pain, nausea or vomiting. Treatments tried: AirBorne and OTC cold medication. The treatment provided mild relief.    Hypertension, follow-up:  BP Readings from Last 3 Encounters:  11/10/17 140/80  05/26/17 108/68  12/04/16 120/76    She was last seen for hypertension 6 months ago.  BP at that visit was 108/68. Management since that visit includes reducing Lisinopril -HCTZ to 1 pill daily due to hyponatremia and low blood pressure .She reports good compliance with treatment. She states home BP have been in the 120s/70s consistently.  She is not having side effects.  She is experiencing none.  Patient denies chest pain, chest pressure/discomfort, claudication, dyspnea, exertional chest pressure/discomfort, fatigue, irregular heart beat, lower extremity edema, near-syncope, orthopnea, palpitations, paroxysmal nocturnal dyspnea, syncope and tachypnea.   Cardiovascular risk factors include advanced age (older than 52 for men, 9 for women) and hypertension.  Use of agents associated with hypertension: none.     Weight trend: fluctuating a bit Wt Readings from Last 3 Encounters:  11/10/17 147 lb (66.7 kg)  05/26/17 144 lb (65.3 kg)  12/04/16 137 lb (62.1 kg)    Current diet: well balanced  ------------------------------------------------------------------------     Allergies  Allergen Reactions  . Codeine  Anaphylaxis  . Ivp Dye [Iodinated Diagnostic Agents] Anaphylaxis    BETADINE OKAY  . Prednisolone Anaphylaxis  . Sulfa Antibiotics Anaphylaxis  . Colestid [Colestipol Hcl]     constipation  . Fosamax [Alendronate Sodium]     heartburn  . Lipitor [Atorvastatin]   . Lovastatin     Other reaction(s): Abdominal pain, Nausea  . Simvastatin Nausea And Vomiting  . Zetia [Ezetimibe]     Abdominal pain      Current Outpatient Medications:  .  Acetaminophen (TYLENOL EXTRA STRENGTH PO), Take by mouth as needed. Reported on 11/23/2015, Disp: , Rfl:  .  albuterol (PROAIR HFA) 108 (90 Base) MCG/ACT inhaler, Inhale 2 puffs into the lungs every 6 (six) hours as needed for wheezing or shortness of breath. SEASONALLY FOR ASTHMA, Disp: 1 Inhaler, Rfl: 3 .  amLODipine (NORVASC) 5 MG tablet, TAKE 1 TABLET (5 MG TOTAL) BY MOUTH DAILY., Disp: 90 tablet, Rfl: 4 .  EPIPEN 2-PAK 0.3 MG/0.3ML SOAJ injection, INJECT INTO THIGH MUSCLE AS NEEDED FOR ANAPHYLAXIS., Disp: , Rfl: 0 .  Fluticasone-Salmeterol (ADVAIR) 100-50 MCG/DOSE AEPB, Inhale 1 puff into the lungs 2 (two) times daily., Disp: 1 each, Rfl: 3 .  gabapentin (NEURONTIN) 100 MG capsule, Take 2 capsules by mouth Nightly., Disp: , Rfl:  .  lisinopril-hydrochlorothiazide (PRINZIDE,ZESTORETIC) 20-12.5 MG tablet, Take 1 tablet by mouth daily., Disp: 3 tablet, Rfl: 0 .  metoprolol succinate (TOPROL-XL) 100 MG 24 hr tablet, TAKE 1 TABLET (100 MG TOTAL) BY MOUTH DAILY., Disp: 90 tablet, Rfl: 4 .  montelukast (SINGULAIR) 10 MG tablet, TAKE 1 TABLET BY MOUTH EVERY DAY, Disp: 30 tablet, Rfl: 12 .  Multiple Vitamin (MULTIVITAMIN) tablet, Take 1 tablet by mouth daily., Disp: , Rfl:  .  predniSONE (DELTASONE) 5 MG tablet, Take 2 tablets by mouth daily., Disp: , Rfl: 1 .  risedronate (ACTONEL) 35 MG tablet, Take 1 tablet (35 mg total) by mouth every 7 (seven) days. with water on empty stomach, nothing by mouth or lie down for next 30 minutes., Disp: 12 tablet, Rfl: 4 .   Vitamin D, Ergocalciferol, (DRISDOL) 50000 units CAPS capsule, TAKE 1 CAPSULE BY MOUTH ONCE A WEEK, Disp: 12 capsule, Rfl: 4  Review of Systems  Constitutional: Negative for appetite change, chills, fatigue and fever.  HENT: Positive for congestion, ear pain (right ear), postnasal drip, rhinorrhea, sneezing and sore throat. Negative for nosebleeds.   Respiratory: Positive for cough (productive with yellow sputum), shortness of breath and wheezing. Negative for chest tightness.   Cardiovascular: Negative for chest pain and palpitations.  Gastrointestinal: Negative for abdominal pain, nausea and vomiting.  Neurological: Positive for headaches. Negative for dizziness and weakness.    Social History   Tobacco Use  . Smoking status: Never Smoker  . Smokeless tobacco: Never Used  Substance Use Topics  . Alcohol use: No   Objective:   BP 140/80 (BP Location: Left Arm, Patient Position: Sitting, Cuff Size: Large)   Pulse 78   Temp 98.7 F (37.1 C) (Oral)   Resp 16   Wt 147 lb (66.7 kg)   SpO2 96% Comment: room air  BMI 23.73 kg/m     Physical Exam  General Appearance:    Alert, cooperative, no distress  HENT:   bilateral TM normal without fluid or infection, neck without nodes, pharynx erythematous without exudate, frontal sinuses tender and nasal mucosa pale and congested  Eyes:    PERRL, conjunctiva/corneas clear, EOM's intact       Lungs:     Clear to auscultation bilaterally, respirations unlabored  Heart:    Regular rate and rhythm  Neurologic:   Awake, alert, oriented x 3. No apparent focal neurological           defect.           Assessment & Plan:     1. Sinusitis, unspecified chronicity, unspecified location  - azithromycin (ZITHROMAX) 250 MG tablet; 2 by mouth today, then 1 daily for 4 days  Dispense: 6 tablet; Refill: 0  2. Benign hypertension Stable since reducing lisinopril-hctz 20/12.5 to one tablet daily.  - Renal function panel  3. Hyperlipidemia,  unspecified hyperlipidemia type Intolerant to multiple statins and ezetimibe.  - Lipid panel  4. Avitaminosis D Compliant with vitamin d supplementation.  - VITAMIN D 25 Hydroxy (Vit-D Deficiency, Fractures)  5. PMR (polymyalgia rheumatica) (HCC) On 10mg  prednisone daily per rheumatology.   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 Is to increase prednisone to 20mg  twice a day until she has been back on Advair for three days.  - albuterol (PROAIR HFA) 108 (90 Base) MCG/ACT inhaler; Inhale 2 puffs into the lungs every 6 (six) hours as needed for wheezing or shortness of breath. SEASONALLY FOR ASTHMA  Dispense: 1 Inhaler; Refill: 3 - Fluticasone-Salmeterol (ADVAIR) 100-50 MCG/DOSE AEPB; Inhale 1 puff into the lungs 2 (two) times daily.  Dispense: 1 each; Refill: 3  Call if symptoms change or if not rapidly improving.          Lelon Huh, MD  [facility]

## 2017-11-10 NOTE — Patient Instructions (Signed)
   Increase prednisone to 20mg  (4 tablets) twice a day for the next 3 days.

## 2017-11-12 DIAGNOSIS — I10 Essential (primary) hypertension: Secondary | ICD-10-CM | POA: Diagnosis not present

## 2017-11-12 DIAGNOSIS — E785 Hyperlipidemia, unspecified: Secondary | ICD-10-CM | POA: Diagnosis not present

## 2017-11-12 DIAGNOSIS — E559 Vitamin D deficiency, unspecified: Secondary | ICD-10-CM | POA: Diagnosis not present

## 2017-11-13 ENCOUNTER — Telehealth: Payer: Self-pay

## 2017-11-13 LAB — RENAL FUNCTION PANEL
Albumin: 3.9 g/dL (ref 3.5–4.8)
BUN / CREAT RATIO: 8 — AB (ref 12–28)
BUN: 6 mg/dL — AB (ref 8–27)
CO2: 25 mmol/L (ref 20–29)
CREATININE: 0.71 mg/dL (ref 0.57–1.00)
Calcium: 9.3 mg/dL (ref 8.7–10.3)
Chloride: 97 mmol/L (ref 96–106)
GFR calc non Af Amer: 84 mL/min/{1.73_m2} (ref >59)
GFR, EST AFRICAN AMERICAN: 97 mL/min/{1.73_m2} (ref >59)
Glucose: 77 mg/dL (ref 65–99)
Phosphorus: 3.8 mg/dL (ref 2.5–4.5)
Potassium: 3.9 mmol/L (ref 3.5–5.2)
SODIUM: 139 mmol/L (ref 134–144)

## 2017-11-13 LAB — LIPID PANEL
CHOL/HDL RATIO: 3.1 ratio (ref 0.0–4.4)
Cholesterol, Total: 183 mg/dL (ref 100–199)
HDL: 60 mg/dL (ref >39)
LDL Calculated: 98 mg/dL (ref 0–99)
Triglycerides: 123 mg/dL (ref 0–149)
VLDL CHOLESTEROL CAL: 25 mg/dL (ref 5–40)

## 2017-11-13 LAB — VITAMIN D 25 HYDROXY (VIT D DEFICIENCY, FRACTURES): Vit D, 25-Hydroxy: 53.1 ng/mL (ref 30.0–100.0)

## 2017-11-13 NOTE — Telephone Encounter (Signed)
Tried calling patient, no answer. Will try again later.  

## 2017-11-13 NOTE — Telephone Encounter (Signed)
-----   Message from Birdie Sons, MD sent at 11/13/2017  8:03 AM EDT ----- Blood sugar, kidney functions, electrolytes, vitamin d and cholesterol are all normal. Continue current medications.  Check labs yearly.

## 2017-11-14 NOTE — Telephone Encounter (Signed)
Advised patient of results.  

## 2017-11-20 DIAGNOSIS — Z800 Family history of malignant neoplasm of digestive organs: Secondary | ICD-10-CM | POA: Diagnosis not present

## 2017-11-25 ENCOUNTER — Ambulatory Visit: Payer: Self-pay | Admitting: Family Medicine

## 2017-12-02 ENCOUNTER — Other Ambulatory Visit: Payer: Self-pay | Admitting: Family Medicine

## 2017-12-02 DIAGNOSIS — M810 Age-related osteoporosis without current pathological fracture: Secondary | ICD-10-CM

## 2017-12-03 DIAGNOSIS — M353 Polymyalgia rheumatica: Secondary | ICD-10-CM | POA: Diagnosis not present

## 2017-12-26 ENCOUNTER — Encounter: Payer: Self-pay | Admitting: *Deleted

## 2017-12-29 ENCOUNTER — Ambulatory Visit
Admission: RE | Admit: 2017-12-29 | Discharge: 2017-12-29 | Disposition: A | Discharge status: Home / Self Care | Payer: Medicare PPO | Source: Ambulatory Visit | Attending: Unknown Physician Specialty | Admitting: Unknown Physician Specialty

## 2017-12-29 ENCOUNTER — Encounter
Admission: RE | Disposition: A | Discharge status: Home / Self Care | Payer: Self-pay | Source: Ambulatory Visit | Attending: Unknown Physician Specialty

## 2017-12-29 ENCOUNTER — Ambulatory Visit: Payer: Medicare PPO | Admitting: Anesthesiology

## 2017-12-29 ENCOUNTER — Encounter: Payer: Self-pay | Admitting: *Deleted

## 2017-12-29 DIAGNOSIS — Z9011 Acquired absence of right breast and nipple: Secondary | ICD-10-CM | POA: Diagnosis not present

## 2017-12-29 DIAGNOSIS — Z7952 Long term (current) use of systemic steroids: Secondary | ICD-10-CM | POA: Insufficient documentation

## 2017-12-29 DIAGNOSIS — K5790 Diverticulosis of intestine, part unspecified, without perforation or abscess without bleeding: Secondary | ICD-10-CM | POA: Diagnosis not present

## 2017-12-29 DIAGNOSIS — Z79899 Other long term (current) drug therapy: Secondary | ICD-10-CM | POA: Insufficient documentation

## 2017-12-29 DIAGNOSIS — K640 First degree hemorrhoids: Secondary | ICD-10-CM | POA: Diagnosis not present

## 2017-12-29 DIAGNOSIS — I10 Essential (primary) hypertension: Secondary | ICD-10-CM | POA: Insufficient documentation

## 2017-12-29 DIAGNOSIS — D125 Benign neoplasm of sigmoid colon: Secondary | ICD-10-CM | POA: Diagnosis not present

## 2017-12-29 DIAGNOSIS — Z7951 Long term (current) use of inhaled steroids: Secondary | ICD-10-CM | POA: Diagnosis not present

## 2017-12-29 DIAGNOSIS — Z853 Personal history of malignant neoplasm of breast: Secondary | ICD-10-CM | POA: Insufficient documentation

## 2017-12-29 DIAGNOSIS — Z1211 Encounter for screening for malignant neoplasm of colon: Secondary | ICD-10-CM | POA: Insufficient documentation

## 2017-12-29 DIAGNOSIS — Z800 Family history of malignant neoplasm of digestive organs: Secondary | ICD-10-CM | POA: Insufficient documentation

## 2017-12-29 DIAGNOSIS — J45909 Unspecified asthma, uncomplicated: Secondary | ICD-10-CM | POA: Diagnosis not present

## 2017-12-29 DIAGNOSIS — K5730 Diverticulosis of large intestine without perforation or abscess without bleeding: Secondary | ICD-10-CM | POA: Diagnosis not present

## 2017-12-29 DIAGNOSIS — I739 Peripheral vascular disease, unspecified: Secondary | ICD-10-CM | POA: Insufficient documentation

## 2017-12-29 DIAGNOSIS — K648 Other hemorrhoids: Secondary | ICD-10-CM | POA: Diagnosis not present

## 2017-12-29 DIAGNOSIS — K635 Polyp of colon: Secondary | ICD-10-CM | POA: Diagnosis not present

## 2017-12-29 DIAGNOSIS — E1151 Type 2 diabetes mellitus with diabetic peripheral angiopathy without gangrene: Secondary | ICD-10-CM | POA: Diagnosis not present

## 2017-12-29 HISTORY — DX: Unspecified osteoarthritis, unspecified site: M19.90

## 2017-12-29 HISTORY — PX: COLONOSCOPY WITH PROPOFOL: SHX5780

## 2017-12-29 SURGERY — COLONOSCOPY WITH PROPOFOL
Anesthesia: General

## 2017-12-29 MED ORDER — LIDOCAINE HCL (PF) 2 % IJ SOLN
INTRAMUSCULAR | Status: AC
  Filled 2017-12-29: qty 10

## 2017-12-29 MED ORDER — SODIUM CHLORIDE 0.9 % IV SOLN: INTRAVENOUS | Status: DC

## 2017-12-29 MED ORDER — LIDOCAINE HCL (PF) 2 % IJ SOLN
INTRAMUSCULAR | Status: DC | PRN
  Administered 2017-12-29: 60 mg

## 2017-12-29 MED ORDER — SODIUM CHLORIDE 0.9 % IV SOLN
INTRAVENOUS | Status: DC
  Administered 2017-12-29: 12:00:00 via INTRAVENOUS

## 2017-12-29 MED ORDER — PROPOFOL 10 MG/ML IV BOLUS
INTRAVENOUS | Status: DC | PRN
  Administered 2017-12-29: 20 mg via INTRAVENOUS
  Administered 2017-12-29: 10 mg via INTRAVENOUS
  Administered 2017-12-29: 20 mg via INTRAVENOUS

## 2017-12-29 MED ORDER — GLYCOPYRROLATE 0.2 MG/ML IJ SOLN
INTRAMUSCULAR | Status: AC
  Filled 2017-12-29: qty 1

## 2017-12-29 MED ORDER — FENTANYL CITRATE (PF) 100 MCG/2ML IJ SOLN
INTRAMUSCULAR | Status: DC | PRN
  Administered 2017-12-29 (×2): 50 ug via INTRAVENOUS

## 2017-12-29 MED ORDER — FENTANYL CITRATE (PF) 100 MCG/2ML IJ SOLN
INTRAMUSCULAR | Status: AC
  Filled 2017-12-29: qty 2

## 2017-12-29 MED ORDER — MIDAZOLAM HCL 2 MG/2ML IJ SOLN
INTRAMUSCULAR | Status: AC
  Filled 2017-12-29: qty 2

## 2017-12-29 MED ORDER — MIDAZOLAM HCL 5 MG/5ML IJ SOLN
INTRAMUSCULAR | Status: DC | PRN
  Administered 2017-12-29: 2 mg via INTRAVENOUS

## 2017-12-29 MED ORDER — PROPOFOL 500 MG/50ML IV EMUL
INTRAVENOUS | Status: DC | PRN
  Administered 2017-12-29: 50 ug/kg/min via INTRAVENOUS

## 2017-12-29 NOTE — Op Note (Signed)
[facility] Gastroenterology Patient Name: Michelle Parrish Procedure Date: 12/29/2017 12:39 PM MRN: [id number] Account #: [id number] Date of Birth: 24-Sep-1940 Admit Type: Outpatient Age: 77 Room: [facility] ENDO ROOM 3 Gender: Female Note Status: Finalized Procedure:            Colonoscopy Indications:          Family history of colon cancer in a first-degree                        relative Providers:            Manya Silvas, MD Referring MD:         Kirstie Peri. Caryn Section, MD (Referring MD) Medicines:            Propofol per Anesthesia Complications:        No immediate complications. Procedure:            Pre-Anesthesia Assessment:                       - After reviewing the risks and benefits, the patient                        was deemed in satisfactory condition to undergo the                        procedure.                       After obtaining informed consent, the colonoscope was                        passed under direct vision. Throughout the procedure,                        the patient's blood pressure, pulse, and oxygen                        saturations were monitored continuously. The                        Colonoscope was introduced through the anus and                        advanced to the the cecum, identified by appendiceal                        orifice and ileocecal valve. The colonoscopy was                        performed without difficulty. The patient tolerated the                        procedure well. The quality of the bowel preparation                        was excellent. Findings:      A diminutive polyp was found in the sigmoid colon. The polyp was       sessile. The polyp was removed with a jumbo cold forceps. Resection and       retrieval were complete.      A few medium-mouthed diverticula were  found in the sigmoid colon.      Internal hemorrhoids were found during endoscopy. The hemorrhoids were       small-medium small. and Grade  I (internal hemorrhoids that do not       prolapse).      The exam was otherwise without abnormality. Impression:           - One diminutive polyp in the sigmoid colon, removed                        with a jumbo cold forceps. Resected and retrieved.                       - Diverticulosis in the sigmoid colon.                       - Internal hemorrhoids.                       - The examination was otherwise normal. Recommendation:       - Await pathology results. Manya Silvas, MD 12/29/2017 1:07:35 PM This report has been signed electronically. Number of Addenda: 0 Note Initiated On: 12/29/2017 12:39 PM Scope Withdrawal Time: 0 hours 10 minutes 5 seconds  Total Procedure Duration: 0 hours 15 minutes 6 seconds       [facility]

## 2017-12-29 NOTE — Anesthesia Postprocedure Evaluation (Signed)
Anesthesia Post Note  Patient: Michelle Parrish  Procedure(s) Performed: COLONOSCOPY WITH PROPOFOL (N/A )  Patient location during evaluation: Endoscopy Anesthesia Type: General Level of consciousness: awake and alert Pain management: pain level controlled Vital Signs Assessment: post-procedure vital signs reviewed and stable Respiratory status: spontaneous breathing, nonlabored ventilation, respiratory function stable and patient connected to nasal cannula oxygen Cardiovascular status: blood pressure returned to baseline and stable Postop Assessment: no apparent nausea or vomiting Anesthetic complications: no     Last Vitals:  Vitals:   12/29/17 1153 12/29/17 1310  BP: 128/86 120/65  Pulse: 86   Resp: 18   Temp: (!) 36 C (!) 36.1 C  SpO2: 99%     Last Pain:  Vitals:   12/29/17 1330  TempSrc:   PainSc: 0-No pain                 Precious Haws Piscitello
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
## 2017-12-29 NOTE — Anesthesia Post-op Follow-up Note (Signed)
Anesthesia QCDR form completed.        

## 2017-12-29 NOTE — Transfer of Care (Signed)
Immediate Anesthesia Transfer of Care Note  Patient: Michelle Parrish  Procedure(s) Performed: COLONOSCOPY WITH PROPOFOL (N/A )  Patient Location: PACU  Anesthesia Type:General  Level of Consciousness: sedated  Airway & Oxygen Therapy: Patient Spontanous Breathing and Patient connected to nasal cannula oxygen  Post-op Assessment: Report given to RN and Post -op Vital signs reviewed and stable  Post vital signs: Reviewed and stable  Last Vitals:  Vitals Value Taken Time  BP    Temp    Pulse 69 12/29/2017  1:10 PM  Resp 16 12/29/2017  1:10 PM  SpO2 100 % 12/29/2017  1:10 PM  Vitals shown include unvalidated device data.  Last Pain:  Vitals:   12/29/17 1153  TempSrc: Tympanic         Complications: No apparent anesthesia complications

## 2017-12-30 LAB — SURGICAL PATHOLOGY

## 2017-12-31 ENCOUNTER — Encounter: Payer: Self-pay | Admitting: Family Medicine

## 2017-12-31 ENCOUNTER — Encounter: Payer: Self-pay | Admitting: Unknown Physician Specialty

## 2017-12-31 DIAGNOSIS — Z86010 Personal history of colonic polyps: Secondary | ICD-10-CM | POA: Insufficient documentation

## 2018-01-06 DIAGNOSIS — R42 Dizziness and giddiness: Secondary | ICD-10-CM | POA: Diagnosis not present

## 2018-01-06 DIAGNOSIS — G4700 Insomnia, unspecified: Secondary | ICD-10-CM | POA: Diagnosis not present

## 2018-01-06 DIAGNOSIS — Z8659 Personal history of other mental and behavioral disorders: Secondary | ICD-10-CM | POA: Diagnosis not present

## 2018-01-06 DIAGNOSIS — E236 Other disorders of pituitary gland: Secondary | ICD-10-CM | POA: Diagnosis not present

## 2018-01-06 DIAGNOSIS — R251 Tremor, unspecified: Secondary | ICD-10-CM | POA: Diagnosis not present

## 2018-01-06 DIAGNOSIS — M791 Myalgia, unspecified site: Secondary | ICD-10-CM | POA: Diagnosis not present

## 2018-01-15 DIAGNOSIS — Z79899 Other long term (current) drug therapy: Secondary | ICD-10-CM | POA: Diagnosis not present

## 2018-01-15 DIAGNOSIS — M353 Polymyalgia rheumatica: Secondary | ICD-10-CM | POA: Diagnosis not present

## 2018-02-26 ENCOUNTER — Ambulatory Visit (INDEPENDENT_AMBULATORY_CARE_PROVIDER_SITE_OTHER): Payer: Medicare PPO | Admitting: Family Medicine

## 2018-02-26 ENCOUNTER — Encounter: Payer: Self-pay | Admitting: Family Medicine

## 2018-02-26 VITALS — BP 160/86 | HR 74 | Temp 97.8°F | Resp 16 | Wt 150.0 lb

## 2018-02-26 DIAGNOSIS — R3129 Other microscopic hematuria: Secondary | ICD-10-CM | POA: Diagnosis not present

## 2018-02-26 DIAGNOSIS — N3090 Cystitis, unspecified without hematuria: Secondary | ICD-10-CM

## 2018-02-26 DIAGNOSIS — Z79899 Other long term (current) drug therapy: Secondary | ICD-10-CM | POA: Diagnosis not present

## 2018-02-26 DIAGNOSIS — M353 Polymyalgia rheumatica: Secondary | ICD-10-CM | POA: Diagnosis not present

## 2018-02-26 LAB — POCT URINALYSIS DIPSTICK
APPEARANCE: NORMAL
Bilirubin, UA: NEGATIVE
GLUCOSE UA: NEGATIVE
Ketones, UA: NEGATIVE
Nitrite, UA: NEGATIVE
ODOR: NORMAL
PH UA: 7.5 (ref 5.0–8.0)
Protein, UA: NEGATIVE
Spec Grav, UA: 1.01 (ref 1.010–1.025)
UROBILINOGEN UA: 0.2 U/dL

## 2018-02-26 MED ORDER — CEPHALEXIN 500 MG PO CAPS: 500.0000 mg | ORAL_CAPSULE | Freq: Two times a day (BID) | ORAL | 0 refills | Status: AC

## 2018-02-26 NOTE — Patient Instructions (Signed)

## 2018-02-26 NOTE — Progress Notes (Signed)
Patient: Michelle Parrish Female    DOB: July 09, 1941   77 y.o.   MRN: [id number] Visit Date: 02/26/2018  Today's Provider: Lavon Paganini, MD   I, Martha Clan, CMA, am acting as scribe for Lavon Paganini, MD.  Chief Complaint  Patient presents with  . Urinary Tract Infection   Subjective:    Urinary Tract Infection   This is a new problem. Episode onset: x 5-6 days. Progression since onset: worsening; nausea and chills/sweats last night. The patient is experiencing no pain. Associated symptoms include chills, flank pain (right), frequency, nausea, sweats and urgency. Pertinent negatives include no discharge, hematuria, hesitancy or vomiting. Treatments tried: Urostat. The treatment provided mild relief.       Allergies  Allergen Reactions  . Codeine Anaphylaxis  . Ivp Dye [Iodinated Diagnostic Agents] Anaphylaxis    BETADINE OKAY  . Prednisolone Anaphylaxis  . Sulfa Antibiotics Anaphylaxis  . Beef-Derived Products Hives    ANY RED MEAT- causes hives and throat closes  . Colestid [Colestipol Hcl]     constipation  . Fosamax [Alendronate Sodium]     heartburn  . Lipitor [Atorvastatin]   . Lovastatin     Other reaction(s): Abdominal pain, Nausea  . Simvastatin Nausea And Vomiting  . Sodium Acetylsalicylate [Aspirin]   . Zetia [Ezetimibe]     Abdominal pain      Current Outpatient Medications:  .  Acetaminophen (TYLENOL EXTRA STRENGTH PO), Take by mouth as needed. Reported on 11/23/2015, Disp: , Rfl:  .  albuterol (PROAIR HFA) 108 (90 Base) MCG/ACT inhaler, Inhale 2 puffs into the lungs every 6 (six) hours as needed for wheezing or shortness of breath. SEASONALLY FOR ASTHMA, Disp: 1 Inhaler, Rfl: 3 .  amLODipine (NORVASC) 5 MG tablet, TAKE 1 TABLET (5 MG TOTAL) BY MOUTH DAILY., Disp: 90 tablet, Rfl: 4 .  EPIPEN 2-PAK 0.3 MG/0.3ML SOAJ injection, INJECT INTO THIGH MUSCLE AS NEEDED FOR ANAPHYLAXIS., Disp: , Rfl: 0 .  Fluticasone-Salmeterol (ADVAIR) 100-50 MCG/DOSE  AEPB, Inhale 1 puff into the lungs 2 (two) times daily., Disp: 1 each, Rfl: 3 .  gabapentin (NEURONTIN) 100 MG capsule, Take 2 capsules by mouth Nightly., Disp: , Rfl:  .  lisinopril-hydrochlorothiazide (PRINZIDE,ZESTORETIC) 20-12.5 MG tablet, Take 1 tablet by mouth daily., Disp: 3 tablet, Rfl: 0 .  metoprolol succinate (TOPROL-XL) 100 MG 24 hr tablet, TAKE 1 TABLET (100 MG TOTAL) BY MOUTH DAILY., Disp: 90 tablet, Rfl: 4 .  montelukast (SINGULAIR) 10 MG tablet, TAKE 1 TABLET BY MOUTH EVERY DAY, Disp: 30 tablet, Rfl: 12 .  Multiple Vitamin (MULTIVITAMIN) tablet, Take 1 tablet by mouth daily., Disp: , Rfl:  .  predniSONE (DELTASONE) 5 MG tablet, Take 2 tablets by mouth daily., Disp: , Rfl: 1 .  risedronate (ACTONEL) 35 MG tablet, TAKE 1 TAB BY MOUTH EVERY 7 DAYS WITH WATER ON EMPTY STOMACH, DO NOT LIE DOWN FOR 30MIN AFTER, Disp: 12 tablet, Rfl: 4 .  Vitamin D, Ergocalciferol, (DRISDOL) 50000 units CAPS capsule, TAKE 1 CAPSULE BY MOUTH ONCE A WEEK, Disp: 12 capsule, Rfl: 4  Review of Systems  Constitutional: Positive for chills.  Gastrointestinal: Positive for nausea. Negative for vomiting.  Genitourinary: Positive for flank pain (right), frequency and urgency. Negative for hematuria and hesitancy.    Social History   Tobacco Use  . Smoking status: Never Smoker  . Smokeless tobacco: Never Used  Substance Use Topics  . Alcohol use: No   Objective:   BP (!) 160/86 (BP Location: Left Arm,  Patient Position: Sitting, Cuff Size: Normal)   Pulse 74   Temp 97.8 F (36.6 C) (Oral)   Resp 16   Wt 150 lb (68 kg)   SpO2 99%   BMI 24.21 kg/m  Vitals:   02/26/18 1039  BP: (!) 160/86  Pulse: 74  Resp: 16  Temp: 97.8 F (36.6 C)  TempSrc: Oral  SpO2: 99%  Weight: 150 lb (68 kg)     Physical Exam  Constitutional: She is oriented to person, place, and time. She appears well-developed and well-nourished. No distress.  HENT:  Head: Normocephalic and atraumatic.  Eyes: Conjunctivae are  normal. No scleral icterus.  Neck: Neck supple. No thyromegaly present.  Cardiovascular: Normal rate, regular rhythm, normal heart sounds and intact distal pulses.  No murmur heard. Pulmonary/Chest: Effort normal and breath sounds normal. No respiratory distress. She has no wheezes. She has no rales.  Abdominal: Soft. She exhibits no distension. There is no tenderness. There is no CVA tenderness.  Musculoskeletal: She exhibits no edema.  Lymphadenopathy:    She has no cervical adenopathy.  Neurological: She is alert and oriented to person, place, and time.  Skin: Skin is warm and dry. Capillary refill takes less than 2 seconds. No rash noted.  Psychiatric: She has a normal mood and affect. Her behavior is normal.  Vitals reviewed.   Results for orders placed or performed in visit on 02/26/18  POCT urinalysis dipstick  Result Value Ref Range   Color, UA light yellow    Clarity, UA clear    Glucose, UA Negative Negative   Bilirubin, UA negative    Ketones, UA negative    Spec Grav, UA 1.010 1.010 - 1.025   Blood, UA NH Trace    pH, UA 7.5 5.0 - 8.0   Protein, UA Negative Negative   Urobilinogen, UA 0.2 0.2 or 1.0 E.U./dL   Nitrite, UA negative    Leukocytes, UA Trace (A) Negative   Appearance normal    Odor normal        Assessment & Plan:   1. Cystitis - symptoms and UA c/w UTI - symptoms similar to previous UTIs - will start treat with Keflex BID x5d - send culture and micro - POCT urinalysis dipstick - Urine Culture  2. Other microscopic hematuria - likely 2/2 cystitis - no signs of pyelo/nephrolithiasis - send micro to confirm hematuria - Urinalysis, microscopic only    Meds ordered this encounter  Medications  . cephALEXin (KEFLEX) 500 MG capsule    Sig: Take 1 capsule (500 mg total) by mouth 2 (two) times daily for 5 days.    Dispense:  10 capsule    Refill:  0     Return if symptoms worsen or fail to improve.   The entirety of the information  documented in the History of Present Illness, Review of Systems and Physical Exam were personally obtained by me. Portions of this information were initially documented by Raquel Sarna Ratchford, CMA and reviewed by me for thoroughness and accuracy.    Virginia Crews, MD, MPH [facility] 02/26/2018 11:16 AM

## 2018-02-27 LAB — URINALYSIS, MICROSCOPIC ONLY
BACTERIA UA: NONE SEEN
CASTS: NONE SEEN /LPF
EPITHELIAL CELLS (NON RENAL): NONE SEEN /HPF (ref 0–10)

## 2018-02-28 LAB — URINE CULTURE: Organism ID, Bacteria: NO GROWTH

## 2018-03-02 ENCOUNTER — Telehealth: Payer: Self-pay

## 2018-03-02 NOTE — Telephone Encounter (Signed)
-----   Message from Virginia Crews, MD sent at 03/02/2018 10:27 AM EDT ----- No growth on urine culture.  Likely a contaminant on UA and not a true urine infection.  Can stop antibiotics if she has not already completed the course.  Virginia Crews, MD, MPH [facility] 03/02/2018 10:27 AM

## 2018-03-02 NOTE — Telephone Encounter (Signed)
Pt advised and acknowledges understanding.

## 2018-03-04 DIAGNOSIS — D485 Neoplasm of uncertain behavior of skin: Secondary | ICD-10-CM | POA: Diagnosis not present

## 2018-03-04 DIAGNOSIS — L570 Actinic keratosis: Secondary | ICD-10-CM | POA: Diagnosis not present

## 2018-03-04 DIAGNOSIS — L308 Other specified dermatitis: Secondary | ICD-10-CM | POA: Diagnosis not present

## 2018-03-04 DIAGNOSIS — D2271 Melanocytic nevi of right lower limb, including hip: Secondary | ICD-10-CM | POA: Diagnosis not present

## 2018-03-04 DIAGNOSIS — D2262 Melanocytic nevi of left upper limb, including shoulder: Secondary | ICD-10-CM | POA: Diagnosis not present

## 2018-03-04 DIAGNOSIS — D225 Melanocytic nevi of trunk: Secondary | ICD-10-CM | POA: Diagnosis not present

## 2018-03-04 DIAGNOSIS — Z08 Encounter for follow-up examination after completed treatment for malignant neoplasm: Secondary | ICD-10-CM | POA: Diagnosis not present

## 2018-03-04 DIAGNOSIS — Z85828 Personal history of other malignant neoplasm of skin: Secondary | ICD-10-CM | POA: Diagnosis not present

## 2018-03-04 DIAGNOSIS — D0461 Carcinoma in situ of skin of right upper limb, including shoulder: Secondary | ICD-10-CM | POA: Diagnosis not present

## 2018-03-04 DIAGNOSIS — D2261 Melanocytic nevi of right upper limb, including shoulder: Secondary | ICD-10-CM | POA: Diagnosis not present

## 2018-03-04 DIAGNOSIS — D2272 Melanocytic nevi of left lower limb, including hip: Secondary | ICD-10-CM | POA: Diagnosis not present

## 2018-03-05 DIAGNOSIS — I10 Essential (primary) hypertension: Secondary | ICD-10-CM | POA: Diagnosis not present

## 2018-03-05 DIAGNOSIS — R011 Cardiac murmur, unspecified: Secondary | ICD-10-CM | POA: Diagnosis not present

## 2018-03-05 DIAGNOSIS — I498 Other specified cardiac arrhythmias: Secondary | ICD-10-CM | POA: Diagnosis not present

## 2018-04-09 DIAGNOSIS — Z79899 Other long term (current) drug therapy: Secondary | ICD-10-CM | POA: Diagnosis not present

## 2018-04-09 DIAGNOSIS — M353 Polymyalgia rheumatica: Secondary | ICD-10-CM | POA: Diagnosis not present

## 2018-04-21 ENCOUNTER — Other Ambulatory Visit: Payer: Self-pay | Admitting: Family Medicine

## 2018-04-21 DIAGNOSIS — I10 Essential (primary) hypertension: Secondary | ICD-10-CM

## 2018-04-25 ENCOUNTER — Other Ambulatory Visit: Payer: Self-pay | Admitting: Family Medicine

## 2018-04-29 ENCOUNTER — Other Ambulatory Visit: Payer: Self-pay | Admitting: Family Medicine

## 2018-05-18 DIAGNOSIS — Z79899 Other long term (current) drug therapy: Secondary | ICD-10-CM | POA: Diagnosis not present

## 2018-05-18 DIAGNOSIS — M353 Polymyalgia rheumatica: Secondary | ICD-10-CM | POA: Diagnosis not present

## 2018-05-22 DIAGNOSIS — D0461 Carcinoma in situ of skin of right upper limb, including shoulder: Secondary | ICD-10-CM | POA: Diagnosis not present

## 2018-05-22 DIAGNOSIS — C44311 Basal cell carcinoma of skin of nose: Secondary | ICD-10-CM | POA: Diagnosis not present

## 2018-05-22 DIAGNOSIS — D485 Neoplasm of uncertain behavior of skin: Secondary | ICD-10-CM | POA: Diagnosis not present

## 2018-05-26 ENCOUNTER — Ambulatory Visit (INDEPENDENT_AMBULATORY_CARE_PROVIDER_SITE_OTHER): Payer: Medicare PPO | Admitting: Family Medicine

## 2018-05-26 ENCOUNTER — Encounter: Payer: Self-pay | Admitting: Family Medicine

## 2018-05-26 VITALS — BP 122/80 | HR 65 | Temp 98.5°F | Resp 16 | Wt 149.0 lb

## 2018-05-26 DIAGNOSIS — E559 Vitamin D deficiency, unspecified: Secondary | ICD-10-CM

## 2018-05-26 DIAGNOSIS — M810 Age-related osteoporosis without current pathological fracture: Secondary | ICD-10-CM | POA: Diagnosis not present

## 2018-05-26 DIAGNOSIS — J301 Allergic rhinitis due to pollen: Secondary | ICD-10-CM

## 2018-05-26 DIAGNOSIS — Z853 Personal history of malignant neoplasm of breast: Secondary | ICD-10-CM | POA: Diagnosis not present

## 2018-05-26 DIAGNOSIS — J45909 Unspecified asthma, uncomplicated: Secondary | ICD-10-CM

## 2018-05-26 DIAGNOSIS — I10 Essential (primary) hypertension: Secondary | ICD-10-CM | POA: Diagnosis not present

## 2018-05-26 DIAGNOSIS — S129XXA Fracture of neck, unspecified, initial encounter: Secondary | ICD-10-CM | POA: Diagnosis not present

## 2018-05-26 DIAGNOSIS — Z1239 Encounter for other screening for malignant neoplasm of breast: Secondary | ICD-10-CM

## 2018-05-26 DIAGNOSIS — M353 Polymyalgia rheumatica: Secondary | ICD-10-CM

## 2018-05-26 DIAGNOSIS — Z23 Encounter for immunization: Secondary | ICD-10-CM | POA: Diagnosis not present

## 2018-05-26 MED ORDER — MONTELUKAST SODIUM 10 MG PO TABS: 10.0000 mg | ORAL_TABLET | Freq: Every day | ORAL | 4 refills | Status: DC

## 2018-05-26 NOTE — Patient Instructions (Addendum)
.   Please call the [facility] [phone]) to schedule a routine screening mammogram.   The CDC recommends two doses of Shingrix (the shingles vaccine) separated by 2 to 6 months for adults age 75 years and older. I recommend checking with your insurance plan regarding coverage for this vaccine.

## 2018-05-26 NOTE — Progress Notes (Signed)
Patient: Michelle Parrish Female    DOB: 05-25-1941   77 y.o.   MRN: [id number] Visit Date: 05/26/2018  Today's Provider: Lelon Huh, MD   Chief Complaint  Patient presents with  . Hyperlipidemia  . Asthma  . Hypertension   Subjective:    HPI  Lipid/Cholesterol, Follow-up:   Last seen for this 6 months ago.  Management changes since that visit include none. . Last Lipid Panel:    Component Value Date/Time   CHOL 183 11/12/2017 0820   TRIG 123 11/12/2017 0820   HDL 60 11/12/2017 0820   CHOLHDL 3.1 11/12/2017 0820   LDLCALC 98 11/12/2017 0820    Risk factors for vascular disease include hypercholesterolemia and hypertension  She reports good compliance with treatment. She is not having side effects.  Current symptoms include none and have been stable. Weight trend: stable Prior visit with dietician: no Current diet: well balanced Current exercise: walking  Wt Readings from Last 3 Encounters:  05/26/18 149 lb (67.6 kg)  02/26/18 150 lb (68 kg)  12/29/17 145 lb (65.8 kg)    -------------------------------------------------------------------  Hypertension, follow-up:  BP Readings from Last 3 Encounters:  05/26/18 122/80  02/26/18 (!) 160/86  12/29/17 120/65    She was last seen for hypertension 6 months ago.  BP at that visit was 140/80. Management since that visit includes no changes. She reports good compliance with treatment. She is not having side effects.  She is exercising. She is adherent to low salt diet.   Outside blood pressures are checked. She is experiencing none.  Patient denies chest pain, chest pressure/discomfort, claudication, dyspnea, exertional chest pressure/discomfort, fatigue, irregular heart beat, lower extremity edema, near-syncope, orthopnea, palpitations, paroxysmal nocturnal dyspnea, syncope and tachypnea.   Cardiovascular risk factors include advanced age (older than 15 for men, 28 for women), dyslipidemia and  hypertension.  Use of agents associated with hypertension: none.     Weight trend: stable Wt Readings from Last 3 Encounters:  05/26/18 149 lb (67.6 kg)  02/26/18 150 lb (68 kg)  12/29/17 145 lb (65.8 kg)    Current diet: well balanced  ------------------------------------------------------------------------ Follow up of Asthma: Patient was last seen for this problem 7 months ago. Management during that visit includes restarting Advair.  Today patient reports this problem is well controlled. .Using Adviar and Singulair consistently every day.   Follow up of Vitamin D deficiency: Patient was last seen for this problem 6 months ago and no changes were made. Patient reports good compliance with treatment.  Lab Results  Component Value Date   VD25OH 53.1 11/12/2017   She continue on actonel every week which she is tolerating well.     Allergies  Allergen Reactions  . Codeine Anaphylaxis  . Ivp Dye [Iodinated Diagnostic Agents] Anaphylaxis    BETADINE OKAY  . Prednisolone Anaphylaxis  . Sulfa Antibiotics Anaphylaxis  . Beef-Derived Products Hives    ANY RED MEAT- causes hives and throat closes  . Colestid [Colestipol Hcl]     constipation  . Fosamax [Alendronate Sodium]     heartburn  . Lipitor [Atorvastatin]   . Lovastatin     Other reaction(s): Abdominal pain, Nausea  . Simvastatin Nausea And Vomiting  . Sodium Acetylsalicylate [Aspirin]   . Zetia [Ezetimibe]     Abdominal pain      Current Outpatient Medications:  .  Acetaminophen (TYLENOL EXTRA STRENGTH PO), Take by mouth as needed. Reported on 11/23/2015, Disp: , Rfl:  .  albuterol (PROAIR HFA) 108 (90 Base) MCG/ACT inhaler, Inhale 2 puffs into the lungs every 6 (six) hours as needed for wheezing or shortness of breath. SEASONALLY FOR ASTHMA, Disp: 1 Inhaler, Rfl: 3 .  amLODipine (NORVASC) 5 MG tablet, TAKE 1 TABLET BY MOUTH EVERY DAY, Disp: 90 tablet, Rfl: 4 .  EPIPEN 2-PAK 0.3 MG/0.3ML SOAJ injection, INJECT  INTO THIGH MUSCLE AS NEEDED FOR ANAPHYLAXIS., Disp: , Rfl: 0 .  Fluticasone-Salmeterol (ADVAIR) 100-50 MCG/DOSE AEPB, Inhale 1 puff into the lungs 2 (two) times daily., Disp: 1 each, Rfl: 3 .  gabapentin (NEURONTIN) 100 MG capsule, Take 2 capsules by mouth Nightly., Disp: , Rfl:  .  lisinopril-hydrochlorothiazide (PRINZIDE,ZESTORETIC) 20-12.5 MG tablet, Take 1 tablet by mouth daily., Disp: 3 tablet, Rfl: 0 .  metoprolol succinate (TOPROL-XL) 100 MG 24 hr tablet, TAKE 1 TABLET BY MOUTH EVERY DAY, Disp: 90 tablet, Rfl: 4 .  montelukast (SINGULAIR) 10 MG tablet, TAKE 1 TABLET BY MOUTH EVERY DAY, Disp: 30 tablet, Rfl: 12 .  Multiple Vitamin (MULTIVITAMIN) tablet, Take 1 tablet by mouth daily., Disp: , Rfl:  .  predniSONE (DELTASONE) 5 MG tablet, Take 0.5 tablets by mouth daily with breakfast. , Disp: , Rfl: 1 .  risedronate (ACTONEL) 35 MG tablet, TAKE 1 TAB BY MOUTH EVERY 7 DAYS WITH WATER ON EMPTY STOMACH, DO NOT LIE DOWN FOR 30MIN AFTER, Disp: 12 tablet, Rfl: 4 .  Vitamin D, Ergocalciferol, (DRISDOL) 50000 units CAPS capsule, TAKE ONE CAPSULE BY MOUTH ONE TIME PER WEEK, Disp: 12 capsule, Rfl: 4  Review of Systems  Constitutional: Negative for appetite change, chills, fatigue and fever.  Respiratory: Negative for chest tightness and shortness of breath.   Cardiovascular: Negative for chest pain and palpitations.  Gastrointestinal: Negative for abdominal pain, nausea and vomiting.  Neurological: Negative for dizziness and weakness.    Social History   Tobacco Use  . Smoking status: Never Smoker  . Smokeless tobacco: Never Used  Substance Use Topics  . Alcohol use: No   Objective:   BP 122/80 (BP Location: Left Arm, Patient Position: Sitting, Cuff Size: Normal)   Pulse 65   Temp 98.5 F (36.9 C) (Oral)   Resp 16   Wt 149 lb (67.6 kg)   SpO2 99% Comment: room air  BMI 24.05 kg/m  Vitals:   05/26/18 1036  BP: 122/80  Pulse: 65  Resp: 16  Temp: 98.5 F (36.9 C)  TempSrc: Oral    SpO2: 99%  Weight: 149 lb (67.6 kg)     Physical Exam   General Appearance:    Alert, cooperative, no distress  Eyes:    PERRL, conjunctiva/corneas clear, EOM's intact       Lungs:     Clear to auscultation bilaterally, respirations unlabored  Heart:    Regular rate and rhythm  Neurologic:   Awake, alert, oriented x 3. No apparent focal neurological           defect.           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  Continue current medications.   - montelukast (SINGULAIR) 10 MG tablet; Take 1 tablet (10 mg total) by mouth daily.  Dispense: 90 tablet; Refill: 4  2. Allergic rhinitis due to pollen, unspecified seasonality  - montelukast (SINGULAIR) 10 MG tablet; Take 1 tablet (10 mg total) by mouth daily.  Dispense: 90 tablet; Refill: 4  3. Benign hypertension Well controlled.  Continue current medications.  4. PMR (polymyalgia rheumatica) (HCC) Continue on low dose of prednisone managed by rheumatolgy  5. Closed fracture of cervical vertebra, unspecified cervical vertebral level, initial encounter ([place]) Continue on chronic bisphosphonate therapy.   6. Osteoporosis, unspecified osteoporosis type, unspecified pathological fracture presence bmd in 2020.   7. H/O malignant neoplasm of breast Due for mammogram remaining breast as below.   8. Avitaminosis D Continue daily vitamin d supplement.   9. Need for influenza vaccination  - Flu vaccine HIGH DOSE PF (Fluzone High dose)  10. Breast cancer screening  - MM Digital Screening Unilat L; Future  Return in about 6 months (around 11/25/2018).       Lelon Huh, MD  [facility]

## 2018-06-12 ENCOUNTER — Ambulatory Visit (INDEPENDENT_AMBULATORY_CARE_PROVIDER_SITE_OTHER): Payer: Medicare PPO | Admitting: Family Medicine

## 2018-06-12 ENCOUNTER — Encounter: Payer: Self-pay | Admitting: Family Medicine

## 2018-06-12 VITALS — BP 130/78 | HR 78 | Temp 97.8°F | Resp 16 | Wt 146.4 lb

## 2018-06-12 DIAGNOSIS — Z7952 Long term (current) use of systemic steroids: Secondary | ICD-10-CM | POA: Diagnosis not present

## 2018-06-12 DIAGNOSIS — J0140 Acute pansinusitis, unspecified: Secondary | ICD-10-CM | POA: Diagnosis not present

## 2018-06-12 MED ORDER — DOXYCYCLINE HYCLATE 100 MG PO TABS: 100.0000 mg | ORAL_TABLET | Freq: Two times a day (BID) | ORAL | 0 refills | Status: DC

## 2018-06-12 NOTE — Progress Notes (Signed)
Patient: Michelle Parrish Female    DOB: [birth year]   77 y.o.   MRN: [id number] Visit Date: 06/12/2018  Today's Provider: Lavon Paganini, MD   Chief Complaint  Patient presents with  . URI   Subjective:    URI   This is a new problem. The current episode started 1 to 4 weeks ago (2 weeks (states 2 days after taking the Flu shot)). The problem has been unchanged. There has been no fever. Associated symptoms include congestion, coughing, diarrhea (off and on for the past 4 days), nausea ("for a week'), rhinorrhea, sinus pain, sneezing and wheezing. Pertinent negatives include no chest pain, plugged ear sensation or sore throat. She has tried inhaler use (Patient on Prednisone, Inhaler,Mucinex) for the symptoms. The treatment provided no relief.       Allergies  Allergen Reactions  . Codeine Anaphylaxis  . Ivp Dye [Iodinated Diagnostic Agents] Anaphylaxis    BETADINE OKAY  . Prednisolone Anaphylaxis  . Sulfa Antibiotics Anaphylaxis  . Beef-Derived Products Hives    ANY RED MEAT- causes hives and throat closes  . Colestid [Colestipol Hcl]     constipation  . Fosamax [Alendronate Sodium]     heartburn  . Lipitor [Atorvastatin]   . Lovastatin     Other reaction(s): Abdominal pain, Nausea  . Simvastatin Nausea And Vomiting  . Sodium Acetylsalicylate [Aspirin]   . Zetia [Ezetimibe]     Abdominal pain      Current Outpatient Medications:  .  Acetaminophen (TYLENOL EXTRA STRENGTH PO), Take by mouth as needed. Reported on 11/23/2015, Disp: , Rfl:  .  albuterol (PROAIR HFA) 108 (90 Base) MCG/ACT inhaler, Inhale 2 puffs into the lungs every 6 (six) hours as needed for wheezing or shortness of breath. SEASONALLY FOR ASTHMA, Disp: 1 Inhaler, Rfl: 3 .  amLODipine (NORVASC) 5 MG tablet, TAKE 1 TABLET BY MOUTH EVERY DAY, Disp: 90 tablet, Rfl: 4 .  EPIPEN 2-PAK 0.3 MG/0.3ML SOAJ injection, INJECT INTO THIGH MUSCLE AS NEEDED FOR ANAPHYLAXIS., Disp: , Rfl: 0 .   Fluticasone-Salmeterol (ADVAIR) 100-50 MCG/DOSE AEPB, Inhale 1 puff into the lungs 2 (two) times daily., Disp: 1 each, Rfl: 3 .  gabapentin (NEURONTIN) 100 MG capsule, Take 2 capsules by mouth Nightly., Disp: , Rfl:  .  lisinopril-hydrochlorothiazide (PRINZIDE,ZESTORETIC) 20-12.5 MG tablet, Take 1 tablet by mouth daily., Disp: 3 tablet, Rfl: 0 .  metoprolol succinate (TOPROL-XL) 100 MG 24 hr tablet, TAKE 1 TABLET BY MOUTH EVERY DAY, Disp: 90 tablet, Rfl: 4 .  montelukast (SINGULAIR) 10 MG tablet, Take 1 tablet (10 mg total) by mouth daily., Disp: 90 tablet, Rfl: 4 .  Multiple Vitamin (MULTIVITAMIN) tablet, Take 1 tablet by mouth daily., Disp: , Rfl:  .  predniSONE (DELTASONE) 5 MG tablet, Take 0.5 tablets by mouth daily with breakfast. , Disp: , Rfl: 1 .  risedronate (ACTONEL) 35 MG tablet, TAKE 1 TAB BY MOUTH EVERY 7 DAYS WITH WATER ON EMPTY STOMACH, DO NOT LIE DOWN FOR 30MIN AFTER, Disp: 12 tablet, Rfl: 4 .  Vitamin D, Ergocalciferol, (DRISDOL) 50000 units CAPS capsule, TAKE ONE CAPSULE BY MOUTH ONE TIME PER WEEK, Disp: 12 capsule, Rfl: 4  Review of Systems  Constitutional: Positive for appetite change, chills and fatigue. Negative for fever.  HENT: Positive for congestion, postnasal drip, rhinorrhea, sinus pressure, sinus pain and sneezing. Negative for sore throat.   Respiratory: Positive for cough, chest tightness, shortness of breath and wheezing.   Cardiovascular: Negative for chest pain,  palpitations and leg swelling.  Gastrointestinal: Positive for diarrhea (off and on for the past 4 days) and nausea ("for a week').    Social History   Tobacco Use  . Smoking status: Never Smoker  . Smokeless tobacco: Never Used  Substance Use Topics  . Alcohol use: No   Objective:   BP 130/78 (BP Location: Left Arm, Patient Position: Sitting, Cuff Size: Normal)   Pulse 78   Temp 97.8 F (36.6 C) (Oral)   Resp 16   Wt 146 lb 6.4 oz (66.4 kg)   SpO2 98%   BMI 23.63 kg/m  Vitals:   06/12/18  1317  BP: 130/78  Pulse: 78  Resp: 16  Temp: 97.8 F (36.6 C)  TempSrc: Oral  SpO2: 98%  Weight: 146 lb 6.4 oz (66.4 kg)     Physical Exam  Constitutional: She is oriented to person, place, and time. She appears well-developed and well-nourished. No distress.  HENT:  Head: Normocephalic and atraumatic.  Right Ear: Tympanic membrane, external ear and ear canal normal.  Left Ear: Tympanic membrane, external ear and ear canal normal.  Nose: Mucosal edema and rhinorrhea present. Right sinus exhibits maxillary sinus tenderness and frontal sinus tenderness. Left sinus exhibits maxillary sinus tenderness and frontal sinus tenderness.  Mouth/Throat: Uvula is midline and mucous membranes are normal. Posterior oropharyngeal erythema present. No oropharyngeal exudate or posterior oropharyngeal edema.  Eyes: Pupils are equal, round, and reactive to light. Conjunctivae are normal. Right eye exhibits no discharge. Left eye exhibits no discharge. No scleral icterus.  Neck: Neck supple. No thyromegaly present.  Cardiovascular: Normal rate, regular rhythm, normal heart sounds and intact distal pulses.  No murmur heard. Pulmonary/Chest: Effort normal and breath sounds normal. No respiratory distress. She has no wheezes. She has no rales.  Abdominal: Soft. She exhibits no distension. There is no tenderness.  Musculoskeletal: She exhibits no edema.  Lymphadenopathy:    She has no cervical adenopathy.  Neurological: She is alert and oriented to person, place, and time.  Skin: Skin is warm and dry. Capillary refill takes less than 2 seconds. No rash noted.  Psychiatric: She has a normal mood and affect. Her behavior is normal.  Vitals reviewed.       Assessment & Plan:   1. 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 doxycycline (VIBRA-TABS) 100 MG tablet    Sig: Take 1 tablet (100 mg total) by mouth 2 (two) times daily.    Dispense:  20 tablet    Refill:  0     Return if symptoms worsen or fail to improve.   The entirety of the information documented in the History of Present Illness, Review of Systems and Physical Exam were personally obtained by me. Portions of this information were initially documented by Tiburcio Pea and Lyndel Pleasure, CMA and reviewed by me for thoroughness and accuracy.  Virginia Crews, MD, MPH [facility] 06/12/2018 1:55 PM

## 2018-06-12 NOTE — Patient Instructions (Signed)

## 2018-06-22 ENCOUNTER — Other Ambulatory Visit: Payer: Self-pay | Admitting: Family Medicine

## 2018-06-22 ENCOUNTER — Ambulatory Visit
Admission: RE | Admit: 2018-06-22 | Discharge: 2018-06-22 | Disposition: A | Discharge status: Home / Self Care | Payer: Medicare PPO | Source: Ambulatory Visit | Attending: Family Medicine | Admitting: Family Medicine

## 2018-06-22 DIAGNOSIS — Z1231 Encounter for screening mammogram for malignant neoplasm of breast: Secondary | ICD-10-CM | POA: Insufficient documentation

## 2018-06-22 DIAGNOSIS — Z1239 Encounter for other screening for malignant neoplasm of breast: Secondary | ICD-10-CM

## 2018-06-22 DIAGNOSIS — Z9011 Acquired absence of right breast and nipple: Secondary | ICD-10-CM | POA: Diagnosis not present

## 2018-06-25 ENCOUNTER — Telehealth: Payer: Self-pay

## 2018-06-25 MED ORDER — AZITHROMYCIN 250 MG PO TABS: ORAL_TABLET | ORAL | 0 refills | Status: AC

## 2018-06-25 NOTE — Telephone Encounter (Signed)
Have sent in prescription for azithromycin. She should also go up 20mg  of prednisone a day for the next 5 days. She has 5mg  tablets so she could take 4 a day, or you can send prescription for 20mg  tablet if she prefers.

## 2018-06-25 NOTE — Telephone Encounter (Signed)
Pt advised.  She is going to take four 5mg  tablets of Prednisone.   Thanks,   -Mickel Baas

## 2018-06-25 NOTE — Telephone Encounter (Signed)
Patient requesting a call back from nurse. Patient reports she is still having cough and congestion, going on for about a month. Patient was seen on 06/12/18 by Dr. B and was started on Doxycycline. Patient reports she was not able to tolerate Doxy and only took 3 doses. Patient refuses to come in for this. And is wanting to know what Dr. B would recommend or send in a new medication. Patient reports she is using Advair BID and ProAir 2-3 times a day. Patient reports she has used Mucinex and reports no symptom improvement.

## 2018-06-29 DIAGNOSIS — M353 Polymyalgia rheumatica: Secondary | ICD-10-CM | POA: Diagnosis not present

## 2018-06-29 DIAGNOSIS — Z79899 Other long term (current) drug therapy: Secondary | ICD-10-CM | POA: Diagnosis not present

## 2018-07-02 ENCOUNTER — Telehealth: Payer: Self-pay | Admitting: Family Medicine

## 2018-07-02 DIAGNOSIS — I10 Essential (primary) hypertension: Secondary | ICD-10-CM

## 2018-07-06 NOTE — Telephone Encounter (Signed)
It looks like Lisinopril-HCTZ was reduced to one a day on 05/26/2018  Thanks,   -Mickel Baas

## 2018-07-06 NOTE — Telephone Encounter (Signed)
Pt states she take 1 tablet of Lisinopril 20-12.5MCG.  States we called wrong frequency into pharmacy.  States it has been changed since a year ago.    She picked up the medication and want to make sure it is corrected before the next refill.

## 2018-07-08 MED ORDER — LISINOPRIL-HYDROCHLOROTHIAZIDE 20-12.5 MG PO TABS: 1.0000 | ORAL_TABLET | Freq: Every day | ORAL | 0 refills | Status: DC

## 2018-07-08 NOTE — Addendum Note (Signed)
Addended by: Birdie Sons on: 07/08/2018 08:19 AM   Modules accepted: Orders

## 2018-07-08 NOTE — Telephone Encounter (Signed)
Can you please call [facility] and change the sig refills on file to ONE tablet daily, #90.

## 2018-08-17 DIAGNOSIS — Z79899 Other long term (current) drug therapy: Secondary | ICD-10-CM | POA: Diagnosis not present

## 2018-08-17 DIAGNOSIS — M353 Polymyalgia rheumatica: Secondary | ICD-10-CM | POA: Diagnosis not present

## 2018-08-17 DIAGNOSIS — R05 Cough: Secondary | ICD-10-CM | POA: Diagnosis not present

## 2018-08-17 DIAGNOSIS — J069 Acute upper respiratory infection, unspecified: Secondary | ICD-10-CM | POA: Diagnosis not present

## 2018-08-31 DIAGNOSIS — I10 Essential (primary) hypertension: Secondary | ICD-10-CM | POA: Diagnosis not present

## 2018-08-31 DIAGNOSIS — I498 Other specified cardiac arrhythmias: Secondary | ICD-10-CM | POA: Diagnosis not present

## 2018-09-03 DIAGNOSIS — C44311 Basal cell carcinoma of skin of nose: Secondary | ICD-10-CM | POA: Diagnosis not present

## 2018-09-28 DIAGNOSIS — L570 Actinic keratosis: Secondary | ICD-10-CM | POA: Diagnosis not present

## 2018-09-28 DIAGNOSIS — B360 Pityriasis versicolor: Secondary | ICD-10-CM | POA: Diagnosis not present

## 2018-09-28 DIAGNOSIS — X32XXXA Exposure to sunlight, initial encounter: Secondary | ICD-10-CM | POA: Diagnosis not present

## 2018-09-28 DIAGNOSIS — Z08 Encounter for follow-up examination after completed treatment for malignant neoplasm: Secondary | ICD-10-CM | POA: Diagnosis not present

## 2018-09-28 DIAGNOSIS — D045 Carcinoma in situ of skin of trunk: Secondary | ICD-10-CM | POA: Diagnosis not present

## 2018-09-28 DIAGNOSIS — D485 Neoplasm of uncertain behavior of skin: Secondary | ICD-10-CM | POA: Diagnosis not present

## 2018-09-28 DIAGNOSIS — Z85828 Personal history of other malignant neoplasm of skin: Secondary | ICD-10-CM | POA: Diagnosis not present

## 2018-09-29 DIAGNOSIS — R399 Unspecified symptoms and signs involving the genitourinary system: Secondary | ICD-10-CM | POA: Diagnosis not present

## 2018-09-29 DIAGNOSIS — R3989 Other symptoms and signs involving the genitourinary system: Secondary | ICD-10-CM | POA: Diagnosis not present

## 2018-09-29 DIAGNOSIS — M353 Polymyalgia rheumatica: Secondary | ICD-10-CM | POA: Diagnosis not present

## 2018-10-14 DIAGNOSIS — L309 Dermatitis, unspecified: Secondary | ICD-10-CM | POA: Diagnosis not present

## 2018-10-14 DIAGNOSIS — D045 Carcinoma in situ of skin of trunk: Secondary | ICD-10-CM | POA: Diagnosis not present

## 2018-10-28 DIAGNOSIS — R7982 Elevated C-reactive protein (CRP): Secondary | ICD-10-CM | POA: Diagnosis not present

## 2018-10-28 DIAGNOSIS — M353 Polymyalgia rheumatica: Secondary | ICD-10-CM | POA: Diagnosis not present

## 2018-10-28 DIAGNOSIS — Z79899 Other long term (current) drug therapy: Secondary | ICD-10-CM | POA: Diagnosis not present

## 2018-11-02 ENCOUNTER — Other Ambulatory Visit: Payer: Self-pay | Admitting: Family Medicine

## 2018-11-02 DIAGNOSIS — J45909 Unspecified asthma, uncomplicated: Secondary | ICD-10-CM

## 2018-11-18 DIAGNOSIS — M353 Polymyalgia rheumatica: Secondary | ICD-10-CM | POA: Diagnosis not present

## 2018-11-20 ENCOUNTER — Telehealth: Payer: Self-pay

## 2018-11-20 NOTE — Telephone Encounter (Signed)
I won't be here on the 8th. She can postpone appointment 2 months.

## 2018-11-20 NOTE — Telephone Encounter (Signed)
Pt had an appointment with you on 11/25/2018.  She is requesting it to be a phone visit if possible.  She does not have access to a computer at this time.  Or will it be okay to reschedule her in a few months.   Thanks,   -Mickel Baas

## 2018-11-23 NOTE — Telephone Encounter (Signed)
Patient advised. Appointment postponed into June.

## 2018-11-25 ENCOUNTER — Ambulatory Visit: Payer: Self-pay | Admitting: Family Medicine

## 2019-01-26 ENCOUNTER — Other Ambulatory Visit: Payer: Self-pay | Admitting: Family Medicine

## 2019-01-26 DIAGNOSIS — M810 Age-related osteoporosis without current pathological fracture: Secondary | ICD-10-CM

## 2019-02-05 ENCOUNTER — Ambulatory Visit: Payer: Self-pay | Admitting: Family Medicine

## 2019-02-05 ENCOUNTER — Ambulatory Visit (INDEPENDENT_AMBULATORY_CARE_PROVIDER_SITE_OTHER): Payer: Medicare PPO | Admitting: Family Medicine

## 2019-02-05 ENCOUNTER — Encounter: Payer: Self-pay | Admitting: Family Medicine

## 2019-02-05 ENCOUNTER — Other Ambulatory Visit: Payer: Self-pay

## 2019-02-05 VITALS — BP 118/62 | HR 68 | Temp 98.4°F | Resp 16 | Ht 66.0 in | Wt 144.0 lb

## 2019-02-05 DIAGNOSIS — Z7952 Long term (current) use of systemic steroids: Secondary | ICD-10-CM | POA: Insufficient documentation

## 2019-02-05 DIAGNOSIS — M810 Age-related osteoporosis without current pathological fracture: Secondary | ICD-10-CM | POA: Diagnosis not present

## 2019-02-05 DIAGNOSIS — E559 Vitamin D deficiency, unspecified: Secondary | ICD-10-CM | POA: Diagnosis not present

## 2019-02-05 DIAGNOSIS — M5432 Sciatica, left side: Secondary | ICD-10-CM

## 2019-02-05 DIAGNOSIS — I10 Essential (primary) hypertension: Secondary | ICD-10-CM | POA: Diagnosis not present

## 2019-02-05 NOTE — Progress Notes (Signed)
Patient: Michelle Parrish Female    DOB: Jan 14, 1941   78 y.o.   MRN: [id number] Visit Date: 02/05/2019  Today's Provider: Lelon Huh, MD   Chief Complaint  Patient presents with  . Hypertension  . Asthma   Subjective:   HPI  Hypertension, follow-up:  BP Readings from Last 3 Encounters:  02/05/19 118/62  06/12/18 130/78  05/26/18 122/80    She was last seen for hypertension 8 months ago.  BP at that visit was 130/78. Management since that visit includes no changes. She reports good compliance with treatment. She is not having side effects.  She is exercising. She is adherent to low salt diet.   Outside blood pressures are checked daily. She reports that this averages in the 120s/70s. She is experiencing none.  Patient denies fatigue, lower extremity edema and palpitations.   Cardiovascular risk factors include dyslipidemia.   Weight trend: stable Wt Readings from Last 3 Encounters:  02/05/19 144 lb (65.3 kg)  06/12/18 146 lb 6.4 oz (66.4 kg)  05/26/18 149 lb (67.6 kg)    Current diet: well balanced   Asthma, follow up: Patient was last seen in the office 8 months ago. No changes were made in her medications. She is currently taking Advair 100/69mcg and albuterol as needed, and reports good symptom control and good compliance.   Follow up osteoporosis She states she had a hard time to remember taking alendronate, so she just stopped taking it altogether. However she did not have any adverse effects or other problems from medication.   Left hip pain She reports for the last few months she she has been having pain in the back of her left hip that radiates down the back of her left leg. It is worse at night. Gets better if she walks around for awhile. Takes occasional tylenol which helps. Is on chronic prednisone for PMR and is working on weaning off of it.   Allergies  Allergen Reactions  . Codeine Anaphylaxis  . Ivp Dye [Iodinated Diagnostic Agents]  Anaphylaxis    BETADINE OKAY  . Prednisolone Anaphylaxis  . Sulfa Antibiotics Anaphylaxis  . Beef-Derived Products Hives    ANY RED MEAT- causes hives and throat closes  . Colestid [Colestipol Hcl]     constipation  . Fosamax [Alendronate Sodium]     heartburn  . Lipitor [Atorvastatin]   . Lovastatin     Other reaction(s): Abdominal pain, Nausea  . Simvastatin Nausea And Vomiting  . Sodium Acetylsalicylate [Aspirin]   . Zetia [Ezetimibe]     Abdominal pain      Current Outpatient Medications:  .  Acetaminophen (TYLENOL EXTRA STRENGTH PO), Take by mouth as needed. Reported on 11/23/2015, Disp: , Rfl:  .  ADVAIR DISKUS 100-50 MCG/DOSE AEPB, TAKE 1 PUFF BY MOUTH TWICE A DAY, Disp: 60 each, Rfl: 3 .  albuterol (PROAIR HFA) 108 (90 Base) MCG/ACT inhaler, Inhale 2 puffs into the lungs every 6 (six) hours as needed for wheezing or shortness of breath. SEASONALLY FOR ASTHMA, Disp: 1 Inhaler, Rfl: 3 .  amLODipine (NORVASC) 5 MG tablet, TAKE 1 TABLET BY MOUTH EVERY DAY, Disp: 90 tablet, Rfl: 4 .  EPIPEN 2-PAK 0.3 MG/0.3ML SOAJ injection, INJECT INTO THIGH MUSCLE AS NEEDED FOR ANAPHYLAXIS., Disp: , Rfl: 0 .  gabapentin (NEURONTIN) 100 MG capsule, Take 2 capsules by mouth Nightly., Disp: , Rfl:  .  lisinopril-hydrochlorothiazide (PRINZIDE,ZESTORETIC) 20-12.5 MG tablet, Take 1 tablet by mouth daily., Disp: 6 tablet,  Rfl: 0 .  metoprolol succinate (TOPROL-XL) 100 MG 24 hr tablet, TAKE 1 TABLET BY MOUTH EVERY DAY, Disp: 90 tablet, Rfl: 4 .  montelukast (SINGULAIR) 10 MG tablet, Take 1 tablet (10 mg total) by mouth daily., Disp: 90 tablet, Rfl: 4 .  Multiple Vitamin (MULTIVITAMIN) tablet, Take 1 tablet by mouth daily., Disp: , Rfl:  .  predniSONE (DELTASONE) 5 MG tablet, Take 0.5 tablets by mouth daily with breakfast. Takes 4mg  daily, Disp: , Rfl: 1 .  Vitamin D, Ergocalciferol, (DRISDOL) 50000 units CAPS capsule, TAKE ONE CAPSULE BY MOUTH ONE TIME PER WEEK, Disp: 12 capsule, Rfl: 4 .  risedronate  (ACTONEL) 35 MG tablet, TAKE 1 TAB BY MOUTH EVERY 7 DAYS WITH WATER ON EMPTY STOMACH, DO NOT LIE DOWN FOR 30MIN AFTER (Patient not taking: Reported on 02/05/2019), Disp: 12 tablet, Rfl: 4  Review of Systems  Constitutional: Negative for activity change, appetite change, chills, diaphoresis, fatigue and fever.  Respiratory: Negative for cough, shortness of breath and wheezing.   Cardiovascular: Negative for chest pain, palpitations and leg swelling.  Endocrine: Negative for cold intolerance, polydipsia, polyphagia and polyuria.  Musculoskeletal: Positive for arthralgias and myalgias.  Allergic/Immunologic: Negative for environmental allergies.  Neurological: Negative for dizziness, light-headedness and headaches.  Psychiatric/Behavioral: Negative for agitation, self-injury, sleep disturbance and suicidal ideas. The patient is not nervous/anxious.     Social History   Tobacco Use  . Smoking status: Never Smoker  . Smokeless tobacco: Never Used  Substance Use Topics  . Alcohol use: No      Objective:   BP 118/62 (BP Location: Left Arm, Patient Position: Sitting, Cuff Size: Normal)   Pulse 68   Temp 98.4 F (36.9 C)   Resp 16   Ht 5\' 6"  (1.676 m)   Wt 144 lb (65.3 kg)   SpO2 100%   BMI 23.24 kg/m  Vitals:   02/05/19 1057  BP: 118/62  Pulse: 68  Resp: 16  Temp: 98.4 F (36.9 C)  SpO2: 100%  Weight: 144 lb (65.3 kg)  Height: 5\' 6"  (1.676 m)     Physical Exam   General Appearance:    Alert, cooperative, no distress  Eyes:    PERRL, conjunctiva/corneas clear, EOM's intact       Lungs:     Clear to auscultation bilaterally, respirations unlabored  Heart:    Regular rate and rhythm  Neurologic:   Awake, alert, oriented x 3. No apparent focal neurological           defect.   MS: Normal ROM of hips.        Assessment & Plan    1. Benign hypertension Well controlled.  Continue current medications.   - Renal function panel  2. Avitaminosis D  - VITAMIN D 25 Hydroxy  (Vit-D Deficiency, Fractures)  3. Sciatica of left side Try low dose qhs ibuprofen  4. Age-related osteoporosis without current pathological fracture Encouraged to start back on alendronate and try to take consistently, but still take if even if she forgets to take it on time.   5. Current chronic use of systemic steroids PMR well controlled, working on weaning steroids. Counseled regarding recommendations for Shingrix.    The entirety of the information documented in the History of Present Illness, Review of Systems and Physical Exam were personally obtained by me. Portions of this information were initially documented by Wilburt Finlay, CMA and reviewed by me for thoroughness and accuracy.      Lelon Huh, MD  [facility]

## 2019-02-05 NOTE — Patient Instructions (Addendum)
.   Please review the attached list of medications and notify my office if there are any errors.    Please bring all of your medications to every appointment so we can make sure that our medication list is the same as yours.    You can take 1-2 OTC ibuprofen (Advil) at night to help    You can take 500mg  magnesium oxide a day when you have heart palpations   The CDC recommends two doses of Shingrix (the shingles vaccine) separated by 2 to 6 months for adults age 72 years and older. I recommend checking with your insurance plan regarding coverage for this vaccine.

## 2019-02-06 LAB — VITAMIN D 25 HYDROXY (VIT D DEFICIENCY, FRACTURES): Vit D, 25-Hydroxy: 71.4 ng/mL (ref 30.0–100.0)

## 2019-02-06 LAB — RENAL FUNCTION PANEL
Albumin: 4.5 g/dL (ref 3.7–4.7)
BUN/Creatinine Ratio: 12 (ref 12–28)
BUN: 8 mg/dL (ref 8–27)
CO2: 24 mmol/L (ref 20–29)
Calcium: 9.8 mg/dL (ref 8.7–10.3)
Chloride: 94 mmol/L — ABNORMAL LOW (ref 96–106)
Creatinine, Ser: 0.69 mg/dL (ref 0.57–1.00)
GFR calc Af Amer: 98 mL/min/{1.73_m2} (ref >59)
GFR calc non Af Amer: 85 mL/min/{1.73_m2} (ref >59)
Glucose: 92 mg/dL (ref 65–99)
Phosphorus: 3.9 mg/dL (ref 3.0–4.3)
Potassium: 4.3 mmol/L (ref 3.5–5.2)
Sodium: 131 mmol/L — ABNORMAL LOW (ref 134–144)

## 2019-02-08 ENCOUNTER — Telehealth: Payer: Self-pay

## 2019-02-08 NOTE — Telephone Encounter (Signed)
Pt advised.   Thanks,   -Michelle Parrish  

## 2019-02-08 NOTE — Telephone Encounter (Signed)
-----   Message from Birdie Sons, MD sent at 02/07/2019  2:45 PM EDT ----- Slightly low sodium level, otherwise labs are all good. Continue current medications.  Follow up 6 months for bp check and labs.

## 2019-02-16 ENCOUNTER — Ambulatory Visit: Payer: Self-pay | Admitting: Family Medicine

## 2019-02-23 DIAGNOSIS — Z79899 Other long term (current) drug therapy: Secondary | ICD-10-CM | POA: Diagnosis not present

## 2019-02-23 DIAGNOSIS — G8929 Other chronic pain: Secondary | ICD-10-CM | POA: Diagnosis not present

## 2019-02-23 DIAGNOSIS — G894 Chronic pain syndrome: Secondary | ICD-10-CM | POA: Diagnosis not present

## 2019-02-23 DIAGNOSIS — M353 Polymyalgia rheumatica: Secondary | ICD-10-CM | POA: Diagnosis not present

## 2019-02-23 DIAGNOSIS — M5441 Lumbago with sciatica, right side: Secondary | ICD-10-CM | POA: Diagnosis not present

## 2019-02-24 ENCOUNTER — Ambulatory Visit: Payer: Self-pay | Admitting: Family Medicine

## 2019-03-17 ENCOUNTER — Other Ambulatory Visit: Payer: Self-pay | Admitting: Family Medicine

## 2019-03-17 DIAGNOSIS — J45909 Unspecified asthma, uncomplicated: Secondary | ICD-10-CM

## 2019-03-23 DIAGNOSIS — Z79899 Other long term (current) drug therapy: Secondary | ICD-10-CM | POA: Diagnosis not present

## 2019-03-23 DIAGNOSIS — M353 Polymyalgia rheumatica: Secondary | ICD-10-CM | POA: Diagnosis not present

## 2019-04-06 ENCOUNTER — Encounter: Payer: Self-pay | Admitting: Family Medicine

## 2019-04-06 ENCOUNTER — Other Ambulatory Visit: Payer: Self-pay

## 2019-04-06 ENCOUNTER — Ambulatory Visit (INDEPENDENT_AMBULATORY_CARE_PROVIDER_SITE_OTHER): Payer: Medicare PPO | Admitting: Family Medicine

## 2019-04-06 VITALS — BP 136/85 | HR 76 | Temp 97.1°F | Wt 143.0 lb

## 2019-04-06 DIAGNOSIS — R350 Frequency of micturition: Secondary | ICD-10-CM

## 2019-04-06 DIAGNOSIS — H8110 Benign paroxysmal vertigo, unspecified ear: Secondary | ICD-10-CM | POA: Diagnosis not present

## 2019-04-06 LAB — POCT URINALYSIS DIPSTICK
Bilirubin, UA: NEGATIVE
Blood, UA: NEGATIVE
Glucose, UA: NEGATIVE
Ketones, UA: NEGATIVE
Leukocytes, UA: NEGATIVE
Nitrite, UA: NEGATIVE
Protein, UA: NEGATIVE
Spec Grav, UA: <=1.005 — AB (ref 1.010–1.025)
Urobilinogen, UA: 0.2 E.U./dL
pH, UA: 7.5 (ref 5.0–8.0)

## 2019-04-06 MED ORDER — MECLIZINE HCL 25 MG PO TABS: 25.0000 mg | ORAL_TABLET | Freq: Three times a day (TID) | ORAL | 0 refills | Status: DC | PRN

## 2019-04-06 NOTE — Patient Instructions (Addendum)
. Please review the attached list of medications and notify my office if there are any errors.   . Please bring all of your medications to every appointment so we can make sure that our medication list is the same as yours.   . We will have flu vaccines available after Labor Day. Please go to your pharmacy or call the office in early September to schedule you flu shot.   Benign Positional Vertigo Vertigo is the feeling that you or your surroundings are moving when they are not. Benign positional vertigo is the most common form of vertigo. This is usually a harmless condition (benign). This condition is positional. This means that symptoms are triggered by certain movements and positions. This condition can be dangerous if it occurs while you are doing something that could cause harm to you or others. This includes activities such as driving or operating machinery. What are the causes? In many cases, the cause of this condition is not known. It may be caused by a disturbance in an area of the inner ear that helps your brain to sense movement and balance. This disturbance can be caused by:  Viral infection (labyrinthitis).  Head injury.  Repetitive motion, such as jumping, dancing, or running. What increases the risk? You are more likely to develop this condition if:  You are a woman.  You are 40 years of age or older. What are the signs or symptoms? Symptoms of this condition usually happen when you move your head or your eyes in different directions. Symptoms may start suddenly, and usually last for less than a minute. They include:  Loss of balance and falling.  Feeling like you are spinning or moving.  Feeling like your surroundings are spinning or moving.  Nausea and vomiting.  Blurred vision.  Dizziness.  Involuntary eye movement (nystagmus). Symptoms can be mild and cause only minor problems, or they can be severe and interfere with daily life. Episodes of benign  positional vertigo may return (recur) over time. Symptoms may improve over time. How is this diagnosed? This condition may be diagnosed based on:  Your medical history.  Physical exam of the head, neck, and ears.  Tests, such as: ? MRI. ? CT scan. ? Eye movement tests. Your health care provider may ask you to change positions quickly while he or she watches you for symptoms of benign positional vertigo, such as nystagmus. Eye movement may be tested with a variety of exams that are designed to evaluate or stimulate vertigo. ? An electroencephalogram (EEG). This records electrical activity in your brain. ? Hearing tests. You may be referred to a health care provider who specializes in ear, nose, and throat (ENT) problems (otolaryngologist) or a provider who specializes in disorders of the nervous system (neurologist). How is this treated?  This condition may be treated in a session in which your health care provider moves your head in specific positions to adjust your inner ear back to normal. Treatment for this condition may take several sessions. Surgery may be needed in severe cases, but this is rare. In some cases, benign positional vertigo may resolve on its own in 2-4 weeks. Follow these instructions at home: Safety  Move slowly. Avoid sudden body or head movements or certain positions, as told by your health care provider.  Avoid driving until your health care provider says it is safe for you to do so.  Avoid operating heavy machinery until your health care provider says it is safe for you  to do so.  Avoid doing any tasks that would be dangerous to you or others if vertigo occurs.  If you have trouble walking or keeping your balance, try using a cane for stability. If you feel dizzy or unstable, sit down right away.  Return to your normal activities as told by your health care provider. Ask your health care provider what activities are safe for you. General instructions  Take  over-the-counter and prescription medicines only as told by your health care provider.  Drink enough fluid to keep your urine pale yellow.  Keep all follow-up visits as told by your health care provider. This is important. Contact a health care provider if:  You have a fever.  Your condition gets worse or you develop new symptoms.  Your family or friends notice any behavioral changes.  You have nausea or vomiting that gets worse.  You have numbness or a "pins and needles" sensation. Get help right away if you:  Have difficulty speaking or moving.  Are always dizzy.  Faint.  Develop severe headaches.  Have weakness in your legs or arms.  Have changes in your hearing or vision.  Develop a stiff neck.  Develop sensitivity to light. Summary  Vertigo is the feeling that you or your surroundings are moving when they are not. Benign positional vertigo is the most common form of vertigo.  The cause of this condition is not known. It may be caused by a disturbance in an area of the inner ear that helps your brain to sense movement and balance.  Symptoms include loss of balance and falling, feeling that you or your surroundings are moving, nausea and vomiting, and blurred vision.  This condition can be diagnosed based on symptoms, physical exam, and other tests, such as MRI, CT scan, eye movement tests, and hearing tests.  Follow safety instructions as told by your health care provider. You will also be told when to contact your health care provider in case of problems. This information is not intended to replace advice given to you by your health care provider. Make sure you discuss any questions you have with your health care provider. Document Released: 05/13/2006 Document Revised: 01/14/2018 Document Reviewed: 01/14/2018 Elsevier Patient Education  2020 [employer].
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
## 2019-04-08 DIAGNOSIS — M5441 Lumbago with sciatica, right side: Secondary | ICD-10-CM | POA: Diagnosis not present

## 2019-04-08 DIAGNOSIS — G8929 Other chronic pain: Secondary | ICD-10-CM | POA: Diagnosis not present

## 2019-04-08 DIAGNOSIS — R42 Dizziness and giddiness: Secondary | ICD-10-CM | POA: Diagnosis not present

## 2019-04-08 DIAGNOSIS — G4700 Insomnia, unspecified: Secondary | ICD-10-CM | POA: Diagnosis not present

## 2019-04-08 DIAGNOSIS — E236 Other disorders of pituitary gland: Secondary | ICD-10-CM | POA: Diagnosis not present

## 2019-04-19 DIAGNOSIS — H26493 Other secondary cataract, bilateral: Secondary | ICD-10-CM | POA: Diagnosis not present

## 2019-04-19 DIAGNOSIS — H02833 Dermatochalasis of right eye, unspecified eyelid: Secondary | ICD-10-CM | POA: Diagnosis not present

## 2019-04-20 DIAGNOSIS — M353 Polymyalgia rheumatica: Secondary | ICD-10-CM | POA: Diagnosis not present

## 2019-04-20 DIAGNOSIS — Z79899 Other long term (current) drug therapy: Secondary | ICD-10-CM | POA: Diagnosis not present

## 2019-05-03 DIAGNOSIS — G8929 Other chronic pain: Secondary | ICD-10-CM | POA: Diagnosis not present

## 2019-05-03 DIAGNOSIS — M5441 Lumbago with sciatica, right side: Secondary | ICD-10-CM | POA: Diagnosis not present

## 2019-05-03 DIAGNOSIS — M545 Low back pain: Secondary | ICD-10-CM | POA: Diagnosis not present

## 2019-05-05 DIAGNOSIS — M545 Low back pain: Secondary | ICD-10-CM | POA: Diagnosis not present

## 2019-05-05 DIAGNOSIS — M5441 Lumbago with sciatica, right side: Secondary | ICD-10-CM | POA: Diagnosis not present

## 2019-05-05 DIAGNOSIS — G8929 Other chronic pain: Secondary | ICD-10-CM | POA: Diagnosis not present

## 2019-05-10 DIAGNOSIS — M5441 Lumbago with sciatica, right side: Secondary | ICD-10-CM | POA: Diagnosis not present

## 2019-05-10 DIAGNOSIS — M545 Low back pain: Secondary | ICD-10-CM | POA: Diagnosis not present

## 2019-05-10 DIAGNOSIS — G8929 Other chronic pain: Secondary | ICD-10-CM | POA: Diagnosis not present

## 2019-05-12 DIAGNOSIS — M5441 Lumbago with sciatica, right side: Secondary | ICD-10-CM | POA: Diagnosis not present

## 2019-05-12 DIAGNOSIS — M545 Low back pain: Secondary | ICD-10-CM | POA: Diagnosis not present

## 2019-05-12 DIAGNOSIS — G8929 Other chronic pain: Secondary | ICD-10-CM | POA: Diagnosis not present

## 2019-05-17 DIAGNOSIS — G8929 Other chronic pain: Secondary | ICD-10-CM | POA: Diagnosis not present

## 2019-05-17 DIAGNOSIS — M5441 Lumbago with sciatica, right side: Secondary | ICD-10-CM | POA: Diagnosis not present

## 2019-05-17 DIAGNOSIS — M545 Low back pain: Secondary | ICD-10-CM | POA: Diagnosis not present

## 2019-05-19 DIAGNOSIS — M5441 Lumbago with sciatica, right side: Secondary | ICD-10-CM | POA: Diagnosis not present

## 2019-05-19 DIAGNOSIS — M545 Low back pain: Secondary | ICD-10-CM | POA: Diagnosis not present

## 2019-05-19 DIAGNOSIS — G8929 Other chronic pain: Secondary | ICD-10-CM | POA: Diagnosis not present

## 2019-05-24 DIAGNOSIS — G8929 Other chronic pain: Secondary | ICD-10-CM | POA: Diagnosis not present

## 2019-05-24 DIAGNOSIS — M545 Low back pain: Secondary | ICD-10-CM | POA: Diagnosis not present

## 2019-05-24 DIAGNOSIS — M5441 Lumbago with sciatica, right side: Secondary | ICD-10-CM | POA: Diagnosis not present

## 2019-05-25 DIAGNOSIS — Z79899 Other long term (current) drug therapy: Secondary | ICD-10-CM | POA: Diagnosis not present

## 2019-05-25 DIAGNOSIS — M353 Polymyalgia rheumatica: Secondary | ICD-10-CM | POA: Diagnosis not present

## 2019-05-26 DIAGNOSIS — M545 Low back pain: Secondary | ICD-10-CM | POA: Diagnosis not present

## 2019-05-26 DIAGNOSIS — G8929 Other chronic pain: Secondary | ICD-10-CM | POA: Diagnosis not present

## 2019-05-26 DIAGNOSIS — M5441 Lumbago with sciatica, right side: Secondary | ICD-10-CM | POA: Diagnosis not present

## 2019-05-29 ENCOUNTER — Other Ambulatory Visit: Payer: Self-pay | Admitting: Family Medicine

## 2019-05-29 DIAGNOSIS — J45909 Unspecified asthma, uncomplicated: Secondary | ICD-10-CM

## 2019-05-29 DIAGNOSIS — J301 Allergic rhinitis due to pollen: Secondary | ICD-10-CM

## 2019-05-31 DIAGNOSIS — M5441 Lumbago with sciatica, right side: Secondary | ICD-10-CM | POA: Diagnosis not present

## 2019-05-31 DIAGNOSIS — M545 Low back pain: Secondary | ICD-10-CM | POA: Diagnosis not present

## 2019-05-31 DIAGNOSIS — G8929 Other chronic pain: Secondary | ICD-10-CM | POA: Diagnosis not present

## 2019-06-02 DIAGNOSIS — Z85828 Personal history of other malignant neoplasm of skin: Secondary | ICD-10-CM | POA: Diagnosis not present

## 2019-06-02 DIAGNOSIS — L570 Actinic keratosis: Secondary | ICD-10-CM | POA: Diagnosis not present

## 2019-06-02 DIAGNOSIS — D2261 Melanocytic nevi of right upper limb, including shoulder: Secondary | ICD-10-CM | POA: Diagnosis not present

## 2019-06-02 DIAGNOSIS — D2262 Melanocytic nevi of left upper limb, including shoulder: Secondary | ICD-10-CM | POA: Diagnosis not present

## 2019-06-02 DIAGNOSIS — X32XXXA Exposure to sunlight, initial encounter: Secondary | ICD-10-CM | POA: Diagnosis not present

## 2019-06-02 DIAGNOSIS — L821 Other seborrheic keratosis: Secondary | ICD-10-CM | POA: Diagnosis not present

## 2019-06-02 DIAGNOSIS — D2271 Melanocytic nevi of right lower limb, including hip: Secondary | ICD-10-CM | POA: Diagnosis not present

## 2019-06-04 DIAGNOSIS — G8929 Other chronic pain: Secondary | ICD-10-CM | POA: Diagnosis not present

## 2019-06-04 DIAGNOSIS — M545 Low back pain: Secondary | ICD-10-CM | POA: Diagnosis not present

## 2019-06-04 DIAGNOSIS — M5441 Lumbago with sciatica, right side: Secondary | ICD-10-CM | POA: Diagnosis not present

## 2019-06-07 DIAGNOSIS — I498 Other specified cardiac arrhythmias: Secondary | ICD-10-CM | POA: Diagnosis not present

## 2019-06-07 DIAGNOSIS — R7982 Elevated C-reactive protein (CRP): Secondary | ICD-10-CM | POA: Diagnosis not present

## 2019-06-07 DIAGNOSIS — M353 Polymyalgia rheumatica: Secondary | ICD-10-CM | POA: Diagnosis not present

## 2019-06-07 DIAGNOSIS — R011 Cardiac murmur, unspecified: Secondary | ICD-10-CM | POA: Diagnosis not present

## 2019-06-07 DIAGNOSIS — I10 Essential (primary) hypertension: Secondary | ICD-10-CM | POA: Diagnosis not present

## 2019-06-08 DIAGNOSIS — G8929 Other chronic pain: Secondary | ICD-10-CM | POA: Diagnosis not present

## 2019-06-08 DIAGNOSIS — M5441 Lumbago with sciatica, right side: Secondary | ICD-10-CM | POA: Diagnosis not present

## 2019-06-08 DIAGNOSIS — M545 Low back pain: Secondary | ICD-10-CM | POA: Diagnosis not present

## 2019-06-10 DIAGNOSIS — M545 Low back pain: Secondary | ICD-10-CM | POA: Diagnosis not present

## 2019-06-10 DIAGNOSIS — M5441 Lumbago with sciatica, right side: Secondary | ICD-10-CM | POA: Diagnosis not present

## 2019-06-10 DIAGNOSIS — G8929 Other chronic pain: Secondary | ICD-10-CM | POA: Diagnosis not present

## 2019-06-15 DIAGNOSIS — M545 Low back pain: Secondary | ICD-10-CM | POA: Diagnosis not present

## 2019-06-15 DIAGNOSIS — G8929 Other chronic pain: Secondary | ICD-10-CM | POA: Diagnosis not present

## 2019-06-15 DIAGNOSIS — M5441 Lumbago with sciatica, right side: Secondary | ICD-10-CM | POA: Diagnosis not present

## 2019-06-16 ENCOUNTER — Other Ambulatory Visit: Payer: Self-pay | Admitting: Family Medicine

## 2019-06-16 DIAGNOSIS — I10 Essential (primary) hypertension: Secondary | ICD-10-CM

## 2019-06-18 DIAGNOSIS — M5441 Lumbago with sciatica, right side: Secondary | ICD-10-CM | POA: Diagnosis not present

## 2019-06-18 DIAGNOSIS — M545 Low back pain: Secondary | ICD-10-CM | POA: Diagnosis not present

## 2019-06-18 DIAGNOSIS — G8929 Other chronic pain: Secondary | ICD-10-CM | POA: Diagnosis not present

## 2019-06-22 DIAGNOSIS — M545 Low back pain: Secondary | ICD-10-CM | POA: Diagnosis not present

## 2019-06-22 DIAGNOSIS — M5441 Lumbago with sciatica, right side: Secondary | ICD-10-CM | POA: Diagnosis not present

## 2019-06-22 DIAGNOSIS — G8929 Other chronic pain: Secondary | ICD-10-CM | POA: Diagnosis not present

## 2019-06-23 ENCOUNTER — Ambulatory Visit (INDEPENDENT_AMBULATORY_CARE_PROVIDER_SITE_OTHER): Payer: Medicare PPO

## 2019-06-23 ENCOUNTER — Other Ambulatory Visit: Payer: Self-pay

## 2019-06-23 DIAGNOSIS — Z23 Encounter for immunization: Secondary | ICD-10-CM

## 2019-06-28 ENCOUNTER — Other Ambulatory Visit: Payer: Self-pay | Admitting: Internal Medicine

## 2019-06-28 DIAGNOSIS — G8929 Other chronic pain: Secondary | ICD-10-CM

## 2019-06-30 ENCOUNTER — Other Ambulatory Visit: Payer: Self-pay

## 2019-06-30 ENCOUNTER — Ambulatory Visit
Admission: RE | Admit: 2019-06-30 | Discharge: 2019-06-30 | Disposition: A | Discharge status: Home / Self Care | Payer: Medicare PPO | Source: Ambulatory Visit | Attending: Internal Medicine | Admitting: Internal Medicine

## 2019-06-30 DIAGNOSIS — M5441 Lumbago with sciatica, right side: Secondary | ICD-10-CM | POA: Diagnosis not present

## 2019-06-30 DIAGNOSIS — M545 Low back pain: Secondary | ICD-10-CM | POA: Diagnosis not present

## 2019-06-30 DIAGNOSIS — G8929 Other chronic pain: Secondary | ICD-10-CM | POA: Diagnosis not present

## 2019-07-13 DIAGNOSIS — M5416 Radiculopathy, lumbar region: Secondary | ICD-10-CM | POA: Diagnosis not present

## 2019-07-23 ENCOUNTER — Other Ambulatory Visit: Payer: Self-pay | Admitting: Family Medicine

## 2019-07-23 NOTE — Telephone Encounter (Signed)
Pt request refill  metoprolol succinate (TOPROL-XL) 100 MG 24 hr tablet  Pt states she thought she had a refill, and took her last one today, so hopes she can get today please  [id number] [address], [place] (Phone) [phone] (Fax)

## 2019-07-27 DIAGNOSIS — Z7952 Long term (current) use of systemic steroids: Secondary | ICD-10-CM | POA: Diagnosis not present

## 2019-08-03 ENCOUNTER — Ambulatory Visit (INDEPENDENT_AMBULATORY_CARE_PROVIDER_SITE_OTHER): Payer: Medicare PPO | Admitting: Family Medicine

## 2019-08-03 ENCOUNTER — Other Ambulatory Visit: Payer: Self-pay

## 2019-08-03 ENCOUNTER — Encounter: Payer: Self-pay | Admitting: Family Medicine

## 2019-08-03 VITALS — BP 130/80 | HR 77 | Temp 96.2°F | Resp 16 | Wt 142.0 lb

## 2019-08-03 DIAGNOSIS — E871 Hypo-osmolality and hyponatremia: Secondary | ICD-10-CM

## 2019-08-03 DIAGNOSIS — I10 Essential (primary) hypertension: Secondary | ICD-10-CM | POA: Diagnosis not present

## 2019-08-03 DIAGNOSIS — E236 Other disorders of pituitary gland: Secondary | ICD-10-CM | POA: Insufficient documentation

## 2019-08-03 DIAGNOSIS — I739 Peripheral vascular disease, unspecified: Secondary | ICD-10-CM | POA: Diagnosis not present

## 2019-08-03 DIAGNOSIS — M353 Polymyalgia rheumatica: Secondary | ICD-10-CM

## 2019-08-03 NOTE — Progress Notes (Signed)
Patient: Michelle Parrish Female    DOB: December 06, 1940   78 y.o.   MRN: [id number] Visit Date: 08/03/2019  Today's Provider: Lelon Huh, MD   No chief complaint on file.  Subjective:     HPI  Hypertension, follow-up:  BP Readings from Last 3 Encounters:  08/03/19 (!) 150/86  04/06/19 136/85  02/05/19 118/62    She was last seen for hypertension 6 months ago.  BP at that visit was 118/62. Management since that visit includes ordering labs which showed slightly low sodium levels. No changes were made. She reports excellent compliance with treatment. She is not having side effects.  She is exercising. She is adherent to low salt diet.   Outside blood pressures are being checked periodically, patient reports systolic ranging from [id number] and diastolic ranging from [id number]. She is experiencing irregular heart beat. Patient reports this is a ongoing matter that is being followed by Dr. Ubaldo Glassing ( cardiology) Patient denies chest pain, chest pressure/discomfort, claudication, dyspnea, exertional chest pressure/discomfort, fatigue, lower extremity edema, near-syncope, orthopnea, palpitations, paroxysmal nocturnal dyspnea, syncope and tachypnea.   Cardiovascular risk factors include advanced age (older than 46 for men, 64 for women) and hypertension.  Use of agents associated with hypertension: none.     Weight trend: stable Wt Readings from Last 3 Encounters:  08/03/19 142 lb (64.4 kg)  04/06/19 143 lb (64.9 kg)  02/05/19 144 lb (65.3 kg)    Current diet: in general, a "healthy" diet    ------------------------------------------------------------------------ Follow up hyponatremia BMP Latest Ref Rng & Units 02/05/2019 11/12/2017 11/26/2016  Glucose 65 - 99 mg/dL 92 77 -  BUN 8 - 27 mg/dL 8 6(L) -  Creatinine 0.57 - 1.00 mg/dL 0.69 0.71 0.60  BUN/Creat Ratio 12 - 28 12 8(L) -  Sodium 134 - 144 mmol/L 131(L) 139 -  Potassium 3.5 - 5.2 mmol/L 4.3 3.9 -  Chloride 96 - 106  mmol/L 94(L) 97 -  CO2 20 - 29 mmol/L 24 25 -  Calcium 8.7 - 10.3 mg/dL 9.8 9.3 -     Allergies  Allergen Reactions  . Codeine Anaphylaxis  . Ivp Dye [Iodinated Diagnostic Agents] Anaphylaxis    BETADINE OKAY  . Prednisolone Anaphylaxis  . Sulfa Antibiotics Anaphylaxis  . Beef-Derived Products Hives    ANY RED MEAT- causes hives and throat closes  . Colestid [Colestipol Hcl]     constipation  . Fosamax [Alendronate Sodium]     heartburn  . Lipitor [Atorvastatin]   . Lovastatin     Other reaction(s): Abdominal pain, Nausea  . Simvastatin Nausea And Vomiting  . Sodium Acetylsalicylate [Aspirin]   . Zetia [Ezetimibe]     Abdominal pain      Current Outpatient Medications:  .  Acetaminophen (TYLENOL EXTRA STRENGTH PO), Take by mouth as needed. Reported on 11/23/2015, Disp: , Rfl:  .  ADVAIR DISKUS 100-50 MCG/DOSE AEPB, INHALE 1 PUFF BY MOUTH TWICE A DAY, Disp: 180 each, Rfl: 3 .  albuterol (PROAIR HFA) 108 (90 Base) MCG/ACT inhaler, Inhale 2 puffs into the lungs every 6 (six) hours as needed for wheezing or shortness of breath. SEASONALLY FOR ASTHMA, Disp: 1 Inhaler, Rfl: 3 .  amLODipine (NORVASC) 5 MG tablet, TAKE 1 TABLET BY MOUTH EVERY DAY, Disp: 90 tablet, Rfl: 4 .  EPIPEN 2-PAK 0.3 MG/0.3ML SOAJ injection, INJECT INTO THIGH MUSCLE AS NEEDED FOR ANAPHYLAXIS., Disp: , Rfl: 0 .  gabapentin (NEURONTIN) 100 MG capsule, Take 2 capsules by mouth  Nightly., Disp: , Rfl:  .  lisinopril-hydrochlorothiazide (PRINZIDE,ZESTORETIC) 20-12.5 MG tablet, Take 1 tablet by mouth daily., Disp: 6 tablet, Rfl: 0 .  metoprolol succinate (TOPROL-XL) 100 MG 24 hr tablet, TAKE 1 TABLET BY MOUTH EVERY DAY, Disp: 90 tablet, Rfl: 4 .  montelukast (SINGULAIR) 10 MG tablet, TAKE 1 TABLET BY MOUTH EVERY DAY, Disp: 90 tablet, Rfl: 4 .  Multiple Vitamin (MULTIVITAMIN) tablet, Take 1 tablet by mouth daily., Disp: , Rfl:  .  risedronate (ACTONEL) 35 MG tablet, TAKE 1 TAB BY MOUTH EVERY 7 DAYS WITH WATER ON EMPTY  STOMACH, DO NOT LIE DOWN FOR 30MIN AFTER, Disp: 12 tablet, Rfl: 4 .  Vitamin D, Ergocalciferol, (DRISDOL) 1.25 MG (50000 UT) CAPS capsule, TAKE ONE CAPSULE BY MOUTH ONCE A WEEK, Disp: 12 capsule, Rfl: 4 .  meclizine (ANTIVERT) 25 MG tablet, Take 1 tablet (25 mg total) by mouth 3 (three) times daily as needed for dizziness. (Patient not taking: Reported on 08/03/2019), Disp: 30 tablet, Rfl: 0  Review of Systems  Constitutional: Negative for appetite change, chills, fatigue and fever.  Respiratory: Negative for chest tightness and shortness of breath.   Cardiovascular: Negative for chest pain and palpitations.  Gastrointestinal: Negative for abdominal pain, nausea and vomiting.  Neurological: Negative for dizziness and weakness.    Social History   Tobacco Use  . Smoking status: Never Smoker  . Smokeless tobacco: Never Used  Substance Use Topics  . Alcohol use: No      Objective:    Vitals:   08/03/19 1027 08/03/19 1038  BP: (!) 150/86 130/80  Pulse: 77   Resp: 16   Temp: (!) 96.2 F (35.7 C)   TempSrc: Oral   Weight: 142 lb (64.4 kg)   Body mass index is 22.92 kg/m.   Physical Exam   General Appearance:    Well developed, well nourished female in no acute distress  Eyes:    PERRL, conjunctiva/corneas clear, EOM's intact       Lungs:     Clear to auscultation bilaterally, respirations unlabored  Heart:    Normal heart rate. Normal rhythm.  2/6 systolic murmur at right upper sternal border  MS:   All extremities are intact.   Neurologic:   Awake, alert, oriented x 3. No apparent focal neurological           defect.          Assessment & Plan    1. Benign hypertension Well controlled.  Continue current medications.    2. Hyponatremia  - Renal function panel  3. Peripheral vascular disease (HCC) Asymptomatic. Compliant with medication.  Continue aggressive risk factor modification.  2   4. PMR (polymyalgia rheumatica) (HCC) Currently in remission  The  entirety of the information documented in the History of Present Illness, Review of Systems and Physical Exam were personally obtained by me. Portions of this information were initially documented by Minette Headland, CMA and reviewed by me for thoroughness and accuracy.      Lelon Huh, MD  [facility]

## 2019-08-04 LAB — RENAL FUNCTION PANEL
Albumin: 4.5 g/dL (ref 3.7–4.7)
BUN/Creatinine Ratio: 10 — ABNORMAL LOW (ref 12–28)
BUN: 6 mg/dL — ABNORMAL LOW (ref 8–27)
CO2: 24 mmol/L (ref 20–29)
Calcium: 9.9 mg/dL (ref 8.7–10.3)
Chloride: 96 mmol/L (ref 96–106)
Creatinine, Ser: 0.58 mg/dL (ref 0.57–1.00)
GFR calc Af Amer: 104 mL/min/{1.73_m2} (ref >59)
GFR calc non Af Amer: 90 mL/min/{1.73_m2} (ref >59)
Glucose: 77 mg/dL (ref 65–99)
Phosphorus: 4.1 mg/dL (ref 3.0–4.3)
Potassium: 4.3 mmol/L (ref 3.5–5.2)
Sodium: 133 mmol/L — ABNORMAL LOW (ref 134–144)

## 2019-08-09 ENCOUNTER — Telehealth: Payer: Self-pay

## 2019-08-09 MED ORDER — DOXYCYCLINE HYCLATE 100 MG PO TABS: 100.0000 mg | ORAL_TABLET | Freq: Two times a day (BID) | ORAL | 0 refills | Status: AC

## 2019-08-09 NOTE — Telephone Encounter (Signed)
Called and spoke with patient, she states that she saw Dr. Caryn Section in office on 08/03/19 and states that she complained of a itchy throat and was advised to monitor symptoms. Patient states that on 08/06/19 she developed a dry cough and her voice became horse. Patient denies, wheezing, shortness of breath, sinus pain/pressure , fever, muscle aches, fatigue or G.I upset. Patient reports that she has a history of asthma and states that she gets bronchitis every year. Patient states that she has been taking otc Airborne and Advair inhaler PRN to help treat her symptoms, which gives her a mild relief. I offered patient a telephone visit to speak with Dr. Caryn Section since she does not have internet access,patient states that she was in on the 15th and wants to know if we can prescribe medication to treat for symptoms. Please advise, patient uses [facility] [address] [place]

## 2019-08-09 NOTE — Telephone Encounter (Signed)
Copied from [place] [id number]. Topic: General - Inquiry >> Aug 09, 2019  1:00 PM Michelle Parrish, [place] wrote: Reason for CRM: Pt called in stating she would like to know if PCP would call an antibiotic for bronchitis. Pt still has a hoarse voice and minor cough. Pt states she gets this every year. Please advise.

## 2019-08-09 NOTE — Telephone Encounter (Signed)
Have sent prescription for doxycyline to her pharmacy.

## 2019-08-10 NOTE — Telephone Encounter (Signed)
Patient advised.

## 2019-10-07 ENCOUNTER — Other Ambulatory Visit: Payer: Self-pay | Admitting: Family Medicine

## 2019-10-07 DIAGNOSIS — I10 Essential (primary) hypertension: Secondary | ICD-10-CM

## 2019-10-07 NOTE — Telephone Encounter (Signed)
Notes to clinic:  This Rx has expired.    Requested Prescriptions  Pending Prescriptions Disp Refills   lisinopril-hydrochlorothiazide (ZESTORETIC) 20-12.5 MG tablet [Pharmacy Med Name: LISINOPRIL-HCTZ 20-12.5 MG TAB] 90 tablet     Sig: TAKE 1 TABLET BY MOUTH EVERY DAY      Cardiovascular:  ACEI + Diuretic Combos Failed - 10/07/2019  1:48 AM      Failed - Na in normal range and within 180 days    Sodium  Date Value Ref Range Status  08/03/2019 133 (L) 134 - 144 mmol/L Final          Passed - K in normal range and within 180 days    Potassium  Date Value Ref Range Status  08/03/2019 4.3 3.5 - 5.2 mmol/L Final          Passed - Cr in normal range and within 180 days    Creatinine, Ser  Date Value Ref Range Status  08/03/2019 0.58 0.57 - 1.00 mg/dL Final          Passed - Ca in normal range and within 180 days    Calcium  Date Value Ref Range Status  08/03/2019 9.9 8.7 - 10.3 mg/dL Final          Passed - Patient is not pregnant      Passed - Last BP in normal range    BP Readings from Last 1 Encounters:  08/03/19 130/80          Passed - Valid encounter within last 6 months    Recent Outpatient Visits           2 months ago Benign hypertension   [facility] Birdie Sons, MD   6 months ago Urinary frequency   [facility] Birdie Sons, MD   8 months ago Benign hypertension   [facility] Birdie Sons, MD   1 year ago Acute non-recurrent pansinusitis   [facility], Dionne Bucy, MD   1 year ago 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

## 2019-10-17 ENCOUNTER — Other Ambulatory Visit: Payer: Self-pay | Admitting: Family Medicine

## 2019-10-17 DIAGNOSIS — I10 Essential (primary) hypertension: Secondary | ICD-10-CM

## 2019-12-08 NOTE — Progress Notes (Signed)
I,Roshena L Chambers,acting as a scribe for Lelon Huh, MD.,have documented all relevant documentation on the behalf of Lelon Huh, MD,as directed by  Lelon Huh, MD while in the presence of Lelon Huh, MD.   Established patient visit    Patient: Michelle Parrish   DOB: 03-30-1941   79 y.o. Female  MRN: [id number] Visit Date: 12/14/2019  Today's healthcare provider: Lelon Huh, MD   Chief Complaint  Patient presents with  . Hypertension   Subjective    HPI Hypertension, follow-up  BP Readings from Last 3 Encounters:  12/14/19 126/82  08/03/19 130/80  04/06/19 136/85   Wt Readings from Last 3 Encounters:  12/14/19 136 lb (61.7 kg)  08/03/19 142 lb (64.4 kg)  04/06/19 143 lb (64.9 kg)     She was last seen for hypertension 4 months ago.  BP at that visit was 130/80. Management since that visit includes continue same medication.  She reports good compliance with treatment. She is not having side effects.  She is following a Regular diet. She is exercising. She does not smoke.  Use of agents associated with hypertension: none.   Outside blood pressures are checked occasionally.  Symptoms:  YES NO    []    [x]    Chest Pain   []    [x]    Chest pressure/discomfort   [x]    [x]    Palpitations   []    [x]    Dyspnea   []    [x]    Orthopnea   []    [x]    Paroxysmal nocturnal dyspnea   [x]   [x]    Lower extremity edema   []    [x]   Syncope   Pertinent labs: Lab Results  Component Value Date   CHOL 183 11/12/2017   HDL 60 11/12/2017   LDLCALC 98 11/12/2017   TRIG 123 11/12/2017   CHOLHDL 3.1 11/12/2017   Lab Results  Component Value Date   NA 133 (L) 08/03/2019   K 4.3 08/03/2019   CO2 24 08/03/2019   GLUCOSE 77 08/03/2019   BUN 6 (L) 08/03/2019   CREATININE 0.58 08/03/2019   CALCIUM 9.9 08/03/2019   GFRNONAA 90 08/03/2019   GFRAA 104 08/03/2019     The 10-year ASCVD risk score (Goff DC Jr., et al., 2013) is: 22.6%    --------------------------------------------------------------------------------------------------- Follow up for Hyponatremia:  The patient was last seen for this 4 months ago. Changes made at last visit include none; continue same medication.  She reports good compliance with treatment. She feels that condition is Unchanged. She is not having side effects.     Urinary frequency: Patient complains of urinary frequency and pressure during urination. Urinary frequency occurs mostly at night. Symptoms started several months ago and are unchanged.   Osteoporosis: Patient stopped taking Actonel 3 months ago because she often forgot to take the medication.   Fatigue: Patient has been more fatigued lately. She doesn't eat red meat and is concerned about anemia.      Medications: Outpatient Medications Prior to Visit  Medication Sig  . ADVAIR DISKUS 100-50 MCG/DOSE AEPB INHALE 1 PUFF BY MOUTH TWICE A DAY  . albuterol (PROAIR HFA) 108 (90 Base) MCG/ACT inhaler Inhale 2 puffs into the lungs every 6 (six) hours as needed for wheezing or shortness of breath. SEASONALLY FOR ASTHMA  . amLODipine (NORVASC) 5 MG tablet TAKE 1 TABLET BY MOUTH EVERY DAY  . EPIPEN 2-PAK 0.3 MG/0.3ML SOAJ injection INJECT INTO THIGH MUSCLE AS NEEDED FOR ANAPHYLAXIS.  Marland Kitchen gabapentin (  NEURONTIN) 100 MG capsule Take 2 capsules by mouth Nightly.  Marland Kitchen lisinopril-hydrochlorothiazide (ZESTORETIC) 20-12.5 MG tablet TAKE 1 TABLET BY MOUTH EVERY DAY  . metoprolol succinate (TOPROL-XL) 100 MG 24 hr tablet TAKE 1 TABLET BY MOUTH EVERY DAY  . montelukast (SINGULAIR) 10 MG tablet TAKE 1 TABLET BY MOUTH EVERY DAY  . Multiple Vitamin (MULTIVITAMIN) tablet Take 1 tablet by mouth daily.  . Vitamin D, Ergocalciferol, (DRISDOL) 1.25 MG (50000 UT) CAPS capsule TAKE ONE CAPSULE BY MOUTH ONCE A WEEK  . risedronate (ACTONEL) 35 MG tablet TAKE 1 TAB BY MOUTH EVERY 7 DAYS WITH WATER ON EMPTY STOMACH, DO NOT LIE DOWN FOR 30MIN AFTER (Patient not  taking: Reported on 12/14/2019)  . [DISCONTINUED] Acetaminophen (TYLENOL EXTRA STRENGTH PO) Take by mouth as needed. Reported on 11/23/2015  . [DISCONTINUED] meclizine (ANTIVERT) 25 MG tablet Take 1 tablet (25 mg total) by mouth 3 (three) times daily as needed for dizziness. (Patient not taking: Reported on 08/03/2019)   No facility-administered medications prior to visit.     Review of Systems  Constitutional: Positive for fatigue. Negative for appetite change, chills and fever.  Respiratory: Negative for chest tightness and shortness of breath.   Cardiovascular: Positive for palpitations and leg swelling (both ankles; improves over night). Negative for chest pain.  Gastrointestinal: Negative for abdominal pain, nausea and vomiting.  Genitourinary: Positive for frequency.       Pressure when urinating  Neurological: Negative for dizziness and weakness.       Objective    BP 126/82 (BP Location: Left Arm, Patient Position: Sitting, Cuff Size: Normal)   Pulse 80   Temp (!) 97.1 F (36.2 C) (Temporal)   Resp 16   Wt 136 lb (61.7 kg)   BMI 21.95 kg/m    Physical Exam   General: Appearance:    Well developed, well nourished female in no acute distress  Eyes:    PERRL, conjunctiva/corneas clear, EOM's intact       Lungs:     Clear to auscultation bilaterally, respirations unlabored  Heart:    Normal heart rate. Normal rhythm.  2/6 systolic murmur  MS:   All extremities are intact.   Neurologic:   Awake, alert, oriented x 3. No apparent focal neurological           defect.        Results for orders placed or performed in visit on 12/14/19  POCT Urinalysis Dipstick  Result Value Ref Range   Color, UA yellow    Clarity, UA clear    Glucose, UA Negative Negative   Bilirubin, UA negative    Ketones, UA negative    Spec Grav, UA 1.010 1.010 - 1.025   Blood, UA Trace (Non hemolyzed)    pH, UA 8.0 5.0 - 8.0   Protein, UA Negative Negative   Urobilinogen, UA 0.2 0.2 or 1.0  E.U./dL   Nitrite, UA negative    Leukocytes, UA Negative Negative   Appearance     Odor       Assessment & Plan    1. Benign hypertension Stable. No change.   2. Avitaminosis D  - VITAMIN D 25 Hydroxy (Vit-D Deficiency, Fractures)  3. Osteoporosis, unspecified osteoporosis type, unspecified pathological fracture presence Patient has been off of Actonel for 4 months. Due for Bone Density.   - DG Bone Density [place]; Future - VITAMIN D 25 Hydroxy (Vit-D Deficiency, Fractures)  4. Breast cancer screening by mammogram - MM 3D SCREEN BREAST UNI LEFT;  Future  5. Fatigue, unspecified type  - Comprehensive Metabolic Panel (CMET) - CBC - TSH  6. Urinary frequency  - POCT Urinalysis Dipstick  No follow-ups on file.      The entirety of the information documented in the History of Present Illness, Review of Systems and Physical Exam were personally obtained by me. Portions of this information were initially documented by the CMA and reviewed by me for thoroughness and accuracy.      Lelon Huh, MD  [facility] [phone] (phone) [phone] (fax)  [place]

## 2019-12-14 ENCOUNTER — Encounter: Payer: Self-pay | Admitting: Family Medicine

## 2019-12-14 ENCOUNTER — Other Ambulatory Visit: Payer: Self-pay

## 2019-12-14 ENCOUNTER — Ambulatory Visit (INDEPENDENT_AMBULATORY_CARE_PROVIDER_SITE_OTHER): Payer: Medicare HMO | Admitting: Family Medicine

## 2019-12-14 VITALS — BP 126/82 | HR 80 | Temp 97.1°F | Resp 16 | Wt 136.0 lb

## 2019-12-14 DIAGNOSIS — R5383 Other fatigue: Secondary | ICD-10-CM

## 2019-12-14 DIAGNOSIS — R350 Frequency of micturition: Secondary | ICD-10-CM

## 2019-12-14 DIAGNOSIS — M810 Age-related osteoporosis without current pathological fracture: Secondary | ICD-10-CM | POA: Diagnosis not present

## 2019-12-14 DIAGNOSIS — E559 Vitamin D deficiency, unspecified: Secondary | ICD-10-CM | POA: Diagnosis not present

## 2019-12-14 DIAGNOSIS — Z1231 Encounter for screening mammogram for malignant neoplasm of breast: Secondary | ICD-10-CM

## 2019-12-14 DIAGNOSIS — I10 Essential (primary) hypertension: Secondary | ICD-10-CM

## 2019-12-14 LAB — POCT URINALYSIS DIPSTICK
Bilirubin, UA: NEGATIVE
Glucose, UA: NEGATIVE
Ketones, UA: NEGATIVE
Leukocytes, UA: NEGATIVE
Nitrite, UA: NEGATIVE
Protein, UA: NEGATIVE
Spec Grav, UA: 1.01 (ref 1.010–1.025)
Urobilinogen, UA: 0.2 E.U./dL
pH, UA: 8 (ref 5.0–8.0)

## 2019-12-15 ENCOUNTER — Telehealth: Payer: Self-pay

## 2019-12-15 LAB — CBC
Hematocrit: 42.2 % (ref 34.0–46.6)
Hemoglobin: 14.3 g/dL (ref 11.1–15.9)
MCH: 28.3 pg (ref 26.6–33.0)
MCHC: 33.9 g/dL (ref 31.5–35.7)
MCV: 83 fL (ref 79–97)
Platelets: 420 10*3/uL (ref 150–450)
RBC: 5.06 x10E6/uL (ref 3.77–5.28)
RDW: 12.5 % (ref 11.7–15.4)
WBC: 9 10*3/uL (ref 3.4–10.8)

## 2019-12-15 LAB — COMPREHENSIVE METABOLIC PANEL
ALT: 11 IU/L (ref 0–32)
AST: 17 IU/L (ref 0–40)
Albumin/Globulin Ratio: 1.8 (ref 1.2–2.2)
Albumin: 4.4 g/dL (ref 3.7–4.7)
Alkaline Phosphatase: 73 IU/L (ref 39–117)
BUN/Creatinine Ratio: 12 (ref 12–28)
BUN: 7 mg/dL — ABNORMAL LOW (ref 8–27)
Bilirubin Total: 0.5 mg/dL (ref 0.0–1.2)
CO2: 23 mmol/L (ref 20–29)
Calcium: 9.6 mg/dL (ref 8.7–10.3)
Chloride: 96 mmol/L (ref 96–106)
Creatinine, Ser: 0.57 mg/dL (ref 0.57–1.00)
GFR calc Af Amer: 104 mL/min/{1.73_m2} (ref >59)
GFR calc non Af Amer: 90 mL/min/{1.73_m2} (ref >59)
Globulin, Total: 2.4 g/dL (ref 1.5–4.5)
Glucose: 72 mg/dL (ref 65–99)
Potassium: 4.4 mmol/L (ref 3.5–5.2)
Sodium: 134 mmol/L (ref 134–144)
Total Protein: 6.8 g/dL (ref 6.0–8.5)

## 2019-12-15 LAB — VITAMIN D 25 HYDROXY (VIT D DEFICIENCY, FRACTURES): Vit D, 25-Hydroxy: 81.2 ng/mL (ref 30.0–100.0)

## 2019-12-15 LAB — TSH: TSH: 2.5 u[IU]/mL (ref 0.450–4.500)

## 2019-12-15 NOTE — Telephone Encounter (Signed)
-----   Message from Birdie Sons, MD sent at 12/15/2019 12:50 PM EDT ----- Labs are good. Vitamin d levels are a little high. Can can back dose to either 50,000 units once a month, or OTC vitamin D3 1,000 units once a day

## 2019-12-15 NOTE — Telephone Encounter (Signed)
Patient called and was read lab note by Dr Caryn Section written 12/15/19.  She states that she will look at which Vit D dosing is cheaper but she feels she will continue with the Rx strength once per month. She verbalized understanding of all information.

## 2019-12-15 NOTE — Telephone Encounter (Signed)
Tried calling patient. Left message to call back. OK for [facility] to advised of results.

## 2020-01-06 ENCOUNTER — Encounter: Payer: Self-pay | Admitting: Family Medicine

## 2020-01-06 ENCOUNTER — Ambulatory Visit
Admission: RE | Admit: 2020-01-06 | Discharge: 2020-01-06 | Disposition: A | Discharge status: Home / Self Care | Payer: Medicare HMO | Source: Ambulatory Visit | Attending: Family Medicine | Admitting: Family Medicine

## 2020-01-06 DIAGNOSIS — M810 Age-related osteoporosis without current pathological fracture: Secondary | ICD-10-CM

## 2020-01-06 DIAGNOSIS — M85852 Other specified disorders of bone density and structure, left thigh: Secondary | ICD-10-CM | POA: Diagnosis not present

## 2020-01-06 DIAGNOSIS — Z1231 Encounter for screening mammogram for malignant neoplasm of breast: Secondary | ICD-10-CM | POA: Diagnosis not present

## 2020-01-06 MED ORDER — RISEDRONATE SODIUM 35 MG PO TABS: ORAL_TABLET | ORAL | 4 refills | Status: DC

## 2020-01-06 MED ORDER — VITAMIN D (ERGOCALCIFEROL) 1.25 MG (50000 UNIT) PO CAPS: 50000.0000 [IU] | ORAL_CAPSULE | ORAL | Status: DC

## 2020-01-17 NOTE — Telephone Encounter (Signed)
encounter opened in error, closed for administrative reasons.

## 2020-01-25 DIAGNOSIS — M47816 Spondylosis without myelopathy or radiculopathy, lumbar region: Secondary | ICD-10-CM | POA: Diagnosis not present

## 2020-01-25 DIAGNOSIS — M818 Other osteoporosis without current pathological fracture: Secondary | ICD-10-CM | POA: Diagnosis not present

## 2020-02-02 DIAGNOSIS — Z85828 Personal history of other malignant neoplasm of skin: Secondary | ICD-10-CM | POA: Diagnosis not present

## 2020-02-02 DIAGNOSIS — L570 Actinic keratosis: Secondary | ICD-10-CM | POA: Diagnosis not present

## 2020-02-02 DIAGNOSIS — D225 Melanocytic nevi of trunk: Secondary | ICD-10-CM | POA: Diagnosis not present

## 2020-02-02 DIAGNOSIS — D2262 Melanocytic nevi of left upper limb, including shoulder: Secondary | ICD-10-CM | POA: Diagnosis not present

## 2020-02-02 DIAGNOSIS — D2272 Melanocytic nevi of left lower limb, including hip: Secondary | ICD-10-CM | POA: Diagnosis not present

## 2020-02-02 DIAGNOSIS — D2261 Melanocytic nevi of right upper limb, including shoulder: Secondary | ICD-10-CM | POA: Diagnosis not present

## 2020-02-02 DIAGNOSIS — L249 Irritant contact dermatitis, unspecified cause: Secondary | ICD-10-CM | POA: Diagnosis not present

## 2020-03-13 DIAGNOSIS — M5441 Lumbago with sciatica, right side: Secondary | ICD-10-CM | POA: Diagnosis not present

## 2020-03-13 DIAGNOSIS — E236 Other disorders of pituitary gland: Secondary | ICD-10-CM | POA: Diagnosis not present

## 2020-03-13 DIAGNOSIS — G8929 Other chronic pain: Secondary | ICD-10-CM | POA: Diagnosis not present

## 2020-03-13 DIAGNOSIS — H8113 Benign paroxysmal vertigo, bilateral: Secondary | ICD-10-CM | POA: Diagnosis not present

## 2020-03-13 DIAGNOSIS — G43411 Hemiplegic migraine, intractable, with status migrainosus: Secondary | ICD-10-CM | POA: Diagnosis not present

## 2020-03-13 DIAGNOSIS — R251 Tremor, unspecified: Secondary | ICD-10-CM | POA: Diagnosis not present

## 2020-03-14 ENCOUNTER — Other Ambulatory Visit: Payer: Self-pay | Admitting: Neurology

## 2020-03-14 DIAGNOSIS — E236 Other disorders of pituitary gland: Secondary | ICD-10-CM

## 2020-03-15 ENCOUNTER — Other Ambulatory Visit: Payer: Self-pay | Admitting: Family Medicine

## 2020-03-15 DIAGNOSIS — J45909 Unspecified asthma, uncomplicated: Secondary | ICD-10-CM

## 2020-03-15 NOTE — Telephone Encounter (Signed)
Requested Prescriptions  Pending Prescriptions Disp Refills   ADVAIR DISKUS 100-50 MCG/DOSE AEPB [Pharmacy Med Name: ADVAIR 100-50 DISKUS] 180 each 2    Sig: TAKE 1 PUFF BY MOUTH TWICE A DAY     Pulmonology:  Combination Products Passed - 03/15/2020  2:18 AM      Passed - Valid encounter within last 12 months    Recent Outpatient Visits          3 months ago Benign hypertension   [facility] Birdie Sons, MD   7 months ago Benign hypertension   [facility] Birdie Sons, MD   11 months ago Urinary frequency   [facility] Birdie Sons, MD   1 year ago Benign hypertension   [facility] Birdie Sons, MD   1 year ago Acute non-recurrent pansinusitis   [facility], Dionne Bucy, MD

## 2020-04-03 ENCOUNTER — Other Ambulatory Visit: Payer: Self-pay | Admitting: Family Medicine

## 2020-04-03 DIAGNOSIS — M810 Age-related osteoporosis without current pathological fracture: Secondary | ICD-10-CM

## 2020-04-05 ENCOUNTER — Other Ambulatory Visit: Payer: Self-pay

## 2020-04-05 ENCOUNTER — Ambulatory Visit
Admission: RE | Admit: 2020-04-05 | Discharge: 2020-04-05 | Disposition: A | Discharge status: Home / Self Care | Payer: Medicare HMO | Source: Ambulatory Visit | Attending: Neurology | Admitting: Neurology

## 2020-04-05 DIAGNOSIS — G43909 Migraine, unspecified, not intractable, without status migrainosus: Secondary | ICD-10-CM | POA: Diagnosis not present

## 2020-04-05 DIAGNOSIS — E236 Other disorders of pituitary gland: Secondary | ICD-10-CM | POA: Diagnosis not present

## 2020-04-05 MED ORDER — GADOBUTROL 1 MMOL/ML IV SOLN
6.0000 mL | Freq: Once | INTRAVENOUS | Status: AC | PRN
  Administered 2020-04-05: 6 mL via INTRAVENOUS

## 2020-04-21 DIAGNOSIS — H43813 Vitreous degeneration, bilateral: Secondary | ICD-10-CM | POA: Diagnosis not present

## 2020-05-24 DIAGNOSIS — M79675 Pain in left toe(s): Secondary | ICD-10-CM | POA: Diagnosis not present

## 2020-05-24 DIAGNOSIS — B351 Tinea unguium: Secondary | ICD-10-CM | POA: Diagnosis not present

## 2020-05-24 DIAGNOSIS — M2012 Hallux valgus (acquired), left foot: Secondary | ICD-10-CM | POA: Diagnosis not present

## 2020-05-24 DIAGNOSIS — M79674 Pain in right toe(s): Secondary | ICD-10-CM | POA: Diagnosis not present

## 2020-05-24 DIAGNOSIS — M778 Other enthesopathies, not elsewhere classified: Secondary | ICD-10-CM | POA: Diagnosis not present

## 2020-05-24 DIAGNOSIS — M2011 Hallux valgus (acquired), right foot: Secondary | ICD-10-CM | POA: Diagnosis not present

## 2020-06-08 DIAGNOSIS — R0602 Shortness of breath: Secondary | ICD-10-CM | POA: Diagnosis not present

## 2020-06-08 DIAGNOSIS — I10 Essential (primary) hypertension: Secondary | ICD-10-CM | POA: Diagnosis not present

## 2020-06-08 DIAGNOSIS — R002 Palpitations: Secondary | ICD-10-CM | POA: Diagnosis not present

## 2020-06-08 DIAGNOSIS — I739 Peripheral vascular disease, unspecified: Secondary | ICD-10-CM | POA: Diagnosis not present

## 2020-06-08 DIAGNOSIS — E785 Hyperlipidemia, unspecified: Secondary | ICD-10-CM | POA: Diagnosis not present

## 2020-06-13 ENCOUNTER — Encounter: Payer: Self-pay | Admitting: Family Medicine

## 2020-06-13 ENCOUNTER — Ambulatory Visit (INDEPENDENT_AMBULATORY_CARE_PROVIDER_SITE_OTHER): Payer: Medicare HMO | Admitting: Family Medicine

## 2020-06-13 ENCOUNTER — Other Ambulatory Visit: Payer: Self-pay

## 2020-06-13 VITALS — BP 137/81 | HR 75 | Temp 98.3°F | Resp 16 | Wt 135.0 lb

## 2020-06-13 DIAGNOSIS — Z23 Encounter for immunization: Secondary | ICD-10-CM | POA: Diagnosis not present

## 2020-06-13 DIAGNOSIS — E559 Vitamin D deficiency, unspecified: Secondary | ICD-10-CM | POA: Diagnosis not present

## 2020-06-13 DIAGNOSIS — I10 Essential (primary) hypertension: Secondary | ICD-10-CM | POA: Diagnosis not present

## 2020-06-13 DIAGNOSIS — Z853 Personal history of malignant neoplasm of breast: Secondary | ICD-10-CM | POA: Diagnosis not present

## 2020-06-13 DIAGNOSIS — M353 Polymyalgia rheumatica: Secondary | ICD-10-CM | POA: Diagnosis not present

## 2020-06-13 DIAGNOSIS — E785 Hyperlipidemia, unspecified: Secondary | ICD-10-CM | POA: Diagnosis not present

## 2020-06-13 DIAGNOSIS — J45909 Unspecified asthma, uncomplicated: Secondary | ICD-10-CM

## 2020-06-13 DIAGNOSIS — M810 Age-related osteoporosis without current pathological fracture: Secondary | ICD-10-CM

## 2020-06-13 MED ORDER — ADVAIR DISKUS 100-50 MCG/DOSE IN AEPB: INHALATION_SPRAY | RESPIRATORY_TRACT | 2 refills | Status: DC

## 2020-06-13 MED ORDER — ALBUTEROL SULFATE HFA 108 (90 BASE) MCG/ACT IN AERS: 2.0000 | INHALATION_SPRAY | Freq: Four times a day (QID) | RESPIRATORY_TRACT | 4 refills | Status: DC | PRN

## 2020-06-13 NOTE — Patient Instructions (Addendum)
.   Please review the attached list of medications and notify my office if there are any errors.   . Please bring all of your medications to every appointment so we can make sure that our medication list is the same as yours.   . Please go to the lab draw station in [address] on the second floor of [facility] . Normal hours are 8:00am to 11:30am and 1:00pm to 4:00pm Monday through Friday

## 2020-06-13 NOTE — Progress Notes (Signed)
Established patient visit   Patient: Michelle Parrish   DOB: [birth year]   79 y.o. Female  MRN: [id number] Visit Date: 06/13/2020  Today's healthcare provider: Lelon Huh, MD   Chief Complaint  Patient presents with  . Hyperlipidemia  . Hypertension   Subjective    HPI  Hypertension, follow-up  BP Readings from Last 3 Encounters:  06/13/20 137/81  12/14/19 126/82  08/03/19 130/80   Wt Readings from Last 3 Encounters:  06/13/20 135 lb (61.2 kg)  12/14/19 136 lb (61.7 kg)  08/03/19 142 lb (64.4 kg)     She was last seen for hypertension 6 months ago.  BP at that visit was 126/82. Management since that visit includes continue same medication.  She reports good compliance with treatment. She is not having side effects.  She is following a Regular diet. She is exercising. She does not smoke.  Use of agents associated with hypertension: none.   Outside blood pressures are 118/80. Symptoms: No chest pain No chest pressure  No palpitations No syncope  No dyspnea No orthopnea  No paroxysmal nocturnal dyspnea No lower extremity edema    The 10-year ASCVD risk score Mikey Bussing DC Jr., et al., 2013) is: 29%   ---------------------------------------------------------------------------------------------------  Follow up for Vitamin D Deficiency:  The patient was last seen for this 6 months ago. Changes made at last visit include none; patient advised to continue Vitamin D supplement. Lab Results  Component Value Date   VD25OH 81.2 12/14/2019    She reports good compliance with treatment. Patient has been taking prescription Vitamin D once a week. She feels that condition is Unchanged. She is not having side effects.   -----------------------------------------------------------------------------------------  Lipid/Cholesterol, Follow-up  Last lipid panel Other pertinent labs  Lab Results  Component Value Date   CHOL 183 11/12/2017   HDL 60 11/12/2017    LDLCALC 98 11/12/2017   TRIG 123 11/12/2017   CHOLHDL 3.1 11/12/2017   Lab Results  Component Value Date   ALT 11 12/14/2019   AST 17 12/14/2019   PLT 420 12/14/2019   TSH 2.500 12/14/2019     She was last seen for this more than 1 year ago.  Management since that visit includes continue same medications.  She reports good compliance with treatment. She is not having side effects.   Symptoms: No chest pain No chest pressure/discomfort  No dyspnea No lower extremity edema  No numbness or tingling of extremity No orthopnea  No palpitations No paroxysmal nocturnal dyspnea  No speech difficulty No syncope   Current diet: well balanced Current exercise: walking  The 10-year ASCVD risk score Mikey Bussing DC Jr., et al., 2013) is: 29%  --------------------------------------------------------------------------------------------------- Follow up asthma  She reports Advair is working well. She rarely requires albuterol rescue inhaler. Has to use it a little more frequently during the allergy season, but still only every week or so and it continues to be very effective with no adverse effects.       Medications: Outpatient Medications Prior to Visit  Medication Sig  . ADVAIR DISKUS 100-50 MCG/DOSE AEPB TAKE 1 PUFF BY MOUTH TWICE A DAY  . albuterol (PROAIR HFA) 108 (90 Base) MCG/ACT inhaler Inhale 2 puffs into the lungs every 6 (six) hours as needed for wheezing or shortness of breath. SEASONALLY FOR ASTHMA  . amLODipine (NORVASC) 5 MG tablet TAKE 1 TABLET BY MOUTH EVERY DAY  . EPIPEN 2-PAK 0.3 MG/0.3ML SOAJ injection INJECT INTO THIGH MUSCLE AS NEEDED FOR ANAPHYLAXIS.  Marland Kitchen  gabapentin (NEURONTIN) 100 MG capsule Take 2 capsules by mouth Nightly.  Marland Kitchen lisinopril-hydrochlorothiazide (ZESTORETIC) 20-12.5 MG tablet TAKE 1 TABLET BY MOUTH EVERY DAY  . metoprolol succinate (TOPROL-XL) 100 MG 24 hr tablet TAKE 1 TABLET BY MOUTH EVERY DAY  . montelukast (SINGULAIR) 10 MG tablet TAKE 1 TABLET BY MOUTH  EVERY DAY  . Multiple Vitamin (MULTIVITAMIN) tablet Take 1 tablet by mouth daily.  . risedronate (ACTONEL) 35 MG tablet TAKE 1 TAB BY MOUTH EVERY 7 DAYS WITH WATER ON EMPTY STOMACH, DO NOT LIE DOWN FOR 30MIN AFTER  . Vitamin D, Ergocalciferol, (DRISDOL) 1.25 MG (50000 UNIT) CAPS capsule Take 1 capsule (50,000 Units total) by mouth every 30 (thirty) days.   No facility-administered medications prior to visit.    Review of Systems  Constitutional: Negative for appetite change, chills, fatigue and fever.  Respiratory: Negative for chest tightness and shortness of breath.   Cardiovascular: Negative for chest pain and palpitations.  Gastrointestinal: Negative for abdominal pain, nausea and vomiting.  Neurological: Negative for dizziness and weakness.     Objective    BP 137/81 (BP Location: Left Arm, Patient Position: Sitting, Cuff Size: Normal)   Pulse 75   Temp 98.3 F (36.8 C) (Oral)   Resp 16   Wt 135 lb (61.2 kg)   BMI 21.79 kg/m   Physical Exam   General: Appearance:    Well developed, well nourished female in no acute distress  Eyes:    PERRL, conjunctiva/corneas clear, EOM's intact       Lungs:     Clear to auscultation bilaterally, respirations unlabored  Heart:    Normal heart rate. Normal rhythm.  1/6 systolic murmur at right upper sternal border  MS:   All extremities are intact.   Neurologic:   Awake, alert, oriented x 3. No apparent focal neurological           defect.         No results found for any visits on 06/13/20.  Assessment & Plan     1. Need for influenza vaccination  - Flu Vaccine QUAD High Dose IM (Fluad)  2. Benign hypertension Well controlled.  Continue current medications.    3. Age-related osteoporosis without current pathological fracture Doing well on risedronate since about 2019. BMD in 2023  4. Hyperlipidemia, unspecified hyperlipidemia type    - CBC - Comprehensive metabolic panel - Lipid panel  5. Avitaminosis D On vitamin D  supplementation.  - VITAMIN D 25 Hydroxy (Vit-D Deficiency, Fractures)  6. PMR (polymyalgia rheumatica) (HCC) Currently in remission , previously followed by Dr. Meda Coffee, now off of prednisone.   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  - albuterol (PROAIR HFA) 108 (90 Base) MCG/ACT inhaler; Inhale 2 puffs into the lungs every 6 (six) hours as needed for wheezing or shortness of breath. SEASONALLY FOR ASTHMA  Dispense: 18 g; Refill: 4 - ADVAIR DISKUS 100-50 MCG/DOSE AEPB; TAKE 1 PUFF BY MOUTH TWICE A DAY  Dispense: 180 each; Refill: 2  8. History of cancer of right breast Need prescription for mastectomy supplies sent to [facility].          The entirety of the information documented in the History of Present Illness, Review of Systems and Physical Exam were personally obtained by me. Portions of this information were initially documented by the CMA and reviewed by me for thoroughness and accuracy.  Lelon Huh, MD  [facility] [phone] (phone) [phone] (fax)  [place]

## 2020-06-14 ENCOUNTER — Telehealth: Payer: Self-pay

## 2020-06-14 DIAGNOSIS — Z9011 Acquired absence of right breast and nipple: Secondary | ICD-10-CM

## 2020-06-14 DIAGNOSIS — Z853 Personal history of malignant neoplasm of breast: Secondary | ICD-10-CM

## 2020-06-14 NOTE — Telephone Encounter (Signed)
Copied from [place] [phone]. Topic: General - Other >> Jun 14, 2020  1:36 PM Oneta Rack wrote: Reason for CRM:  Caller name: Marcene Brawn  Relation to pt: from [facility]  Call back number: [phone]  fax [phone]   Reason for call: requesting demographic and supporting office notes or dx codes for Mastectomy supplies, also indicate  left , right or bilateral

## 2020-06-14 NOTE — Telephone Encounter (Signed)
Done. Office note from 06/13/2020 faxed along with Demographics sheet. ICD10 diagnosis codes added to signed order.

## 2020-06-15 DIAGNOSIS — E559 Vitamin D deficiency, unspecified: Secondary | ICD-10-CM | POA: Diagnosis not present

## 2020-06-15 DIAGNOSIS — E785 Hyperlipidemia, unspecified: Secondary | ICD-10-CM | POA: Diagnosis not present

## 2020-06-16 LAB — CBC
Hematocrit: 41.9 % (ref 34.0–46.6)
Hemoglobin: 13.9 g/dL (ref 11.1–15.9)
MCH: 27.5 pg (ref 26.6–33.0)
MCHC: 33.2 g/dL (ref 31.5–35.7)
MCV: 83 fL (ref 79–97)
Platelets: 374 10*3/uL (ref 150–450)
RBC: 5.05 x10E6/uL (ref 3.77–5.28)
RDW: 12.6 % (ref 11.7–15.4)
WBC: 7.1 10*3/uL (ref 3.4–10.8)

## 2020-06-16 LAB — COMPREHENSIVE METABOLIC PANEL
ALT: 9 IU/L (ref 0–32)
AST: 16 IU/L (ref 0–40)
Albumin/Globulin Ratio: 1.7 (ref 1.2–2.2)
Albumin: 4.3 g/dL (ref 3.7–4.7)
Alkaline Phosphatase: 67 IU/L (ref 44–121)
BUN/Creatinine Ratio: 8 — ABNORMAL LOW (ref 12–28)
BUN: 5 mg/dL — ABNORMAL LOW (ref 8–27)
Bilirubin Total: 0.5 mg/dL (ref 0.0–1.2)
CO2: 24 mmol/L (ref 20–29)
Calcium: 9.2 mg/dL (ref 8.7–10.3)
Chloride: 98 mmol/L (ref 96–106)
Creatinine, Ser: 0.59 mg/dL (ref 0.57–1.00)
GFR calc Af Amer: 102 mL/min/{1.73_m2} (ref >59)
GFR calc non Af Amer: 89 mL/min/{1.73_m2} (ref >59)
Globulin, Total: 2.5 g/dL (ref 1.5–4.5)
Glucose: 82 mg/dL (ref 65–99)
Potassium: 4.6 mmol/L (ref 3.5–5.2)
Sodium: 135 mmol/L (ref 134–144)
Total Protein: 6.8 g/dL (ref 6.0–8.5)

## 2020-06-16 LAB — LIPID PANEL
Chol/HDL Ratio: 4.1 ratio (ref 0.0–4.4)
Cholesterol, Total: 194 mg/dL (ref 100–199)
HDL: 47 mg/dL (ref >39)
LDL Chol Calc (NIH): 126 mg/dL — ABNORMAL HIGH (ref 0–99)
Triglycerides: 119 mg/dL (ref 0–149)
VLDL Cholesterol Cal: 21 mg/dL (ref 5–40)

## 2020-06-16 LAB — VITAMIN D 25 HYDROXY (VIT D DEFICIENCY, FRACTURES): Vit D, 25-Hydroxy: 53.2 ng/mL (ref 30.0–100.0)

## 2020-06-19 ENCOUNTER — Encounter: Payer: Self-pay | Admitting: Family Medicine

## 2020-06-22 DIAGNOSIS — C50111 Malignant neoplasm of central portion of right female breast: Secondary | ICD-10-CM | POA: Diagnosis not present

## 2020-06-22 DIAGNOSIS — Z4431 Encounter for fitting and adjustment of external right breast prosthesis: Secondary | ICD-10-CM | POA: Diagnosis not present

## 2020-06-22 DIAGNOSIS — Z853 Personal history of malignant neoplasm of breast: Secondary | ICD-10-CM | POA: Diagnosis not present

## 2020-06-22 DIAGNOSIS — Z9011 Acquired absence of right breast and nipple: Secondary | ICD-10-CM | POA: Diagnosis not present

## 2020-07-26 DIAGNOSIS — M8949 Other hypertrophic osteoarthropathy, multiple sites: Secondary | ICD-10-CM | POA: Diagnosis not present

## 2020-07-26 DIAGNOSIS — M47816 Spondylosis without myelopathy or radiculopathy, lumbar region: Secondary | ICD-10-CM | POA: Diagnosis not present

## 2020-07-26 DIAGNOSIS — M791 Myalgia, unspecified site: Secondary | ICD-10-CM | POA: Diagnosis not present

## 2020-08-18 ENCOUNTER — Other Ambulatory Visit: Payer: Self-pay | Admitting: Family Medicine

## 2020-08-18 DIAGNOSIS — J301 Allergic rhinitis due to pollen: Secondary | ICD-10-CM

## 2020-08-18 DIAGNOSIS — J45909 Unspecified asthma, uncomplicated: Secondary | ICD-10-CM

## 2020-08-29 ENCOUNTER — Encounter: Payer: Self-pay | Admitting: Family Medicine

## 2020-08-30 NOTE — Telephone Encounter (Signed)
Can this patient get an order for Covid test. She's had symptoms since last week.

## 2020-09-06 ENCOUNTER — Other Ambulatory Visit: Payer: Self-pay | Admitting: Family Medicine

## 2020-09-06 DIAGNOSIS — I10 Essential (primary) hypertension: Secondary | ICD-10-CM

## 2020-09-10 DIAGNOSIS — S93402A Sprain of unspecified ligament of left ankle, initial encounter: Secondary | ICD-10-CM | POA: Diagnosis not present

## 2020-09-22 IMAGING — MG DIGITAL SCREENING UNILAT LEFT W/ TOMO W/ CAD
6 series · 6 of 18 positions shown · non-contrast
Comparison: Previous exam(s).

CLINICAL DATA: Screening.

EXAM:
DIGITAL SCREENING UNILATERAL LEFT MAMMOGRAM WITH CAD AND TOMO

[L MLO synth-2D (1 of 2)]
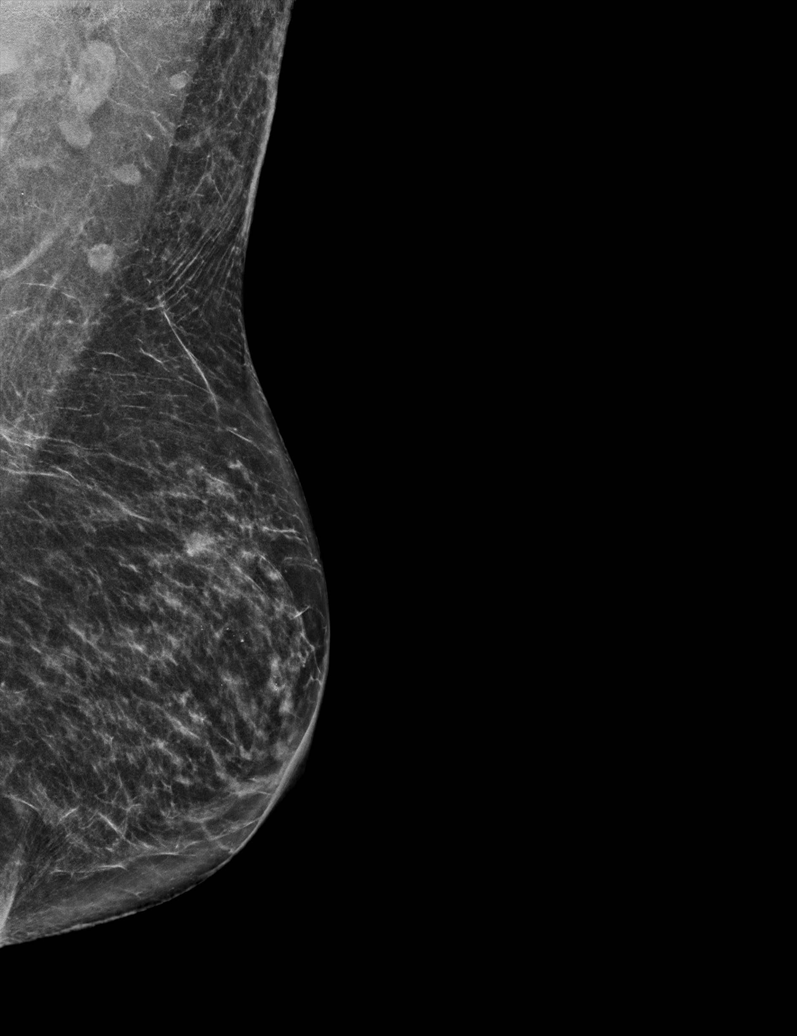

[L CC synth-2D]
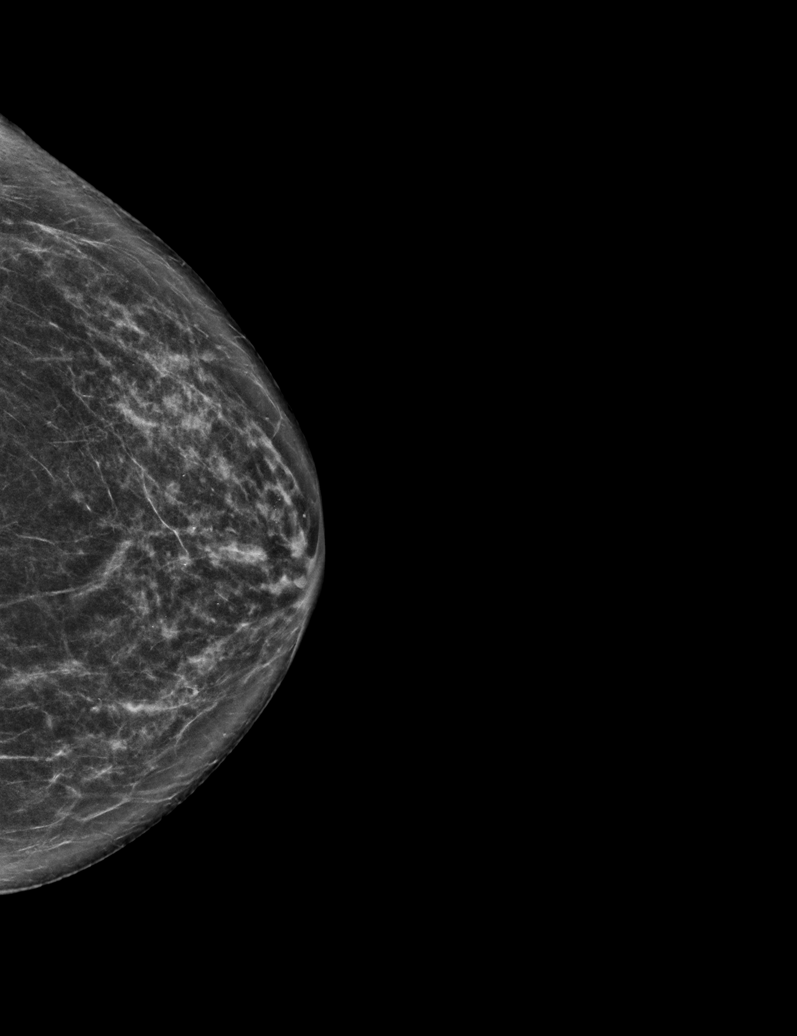

[L MLO synth-2D (2 of 2)]
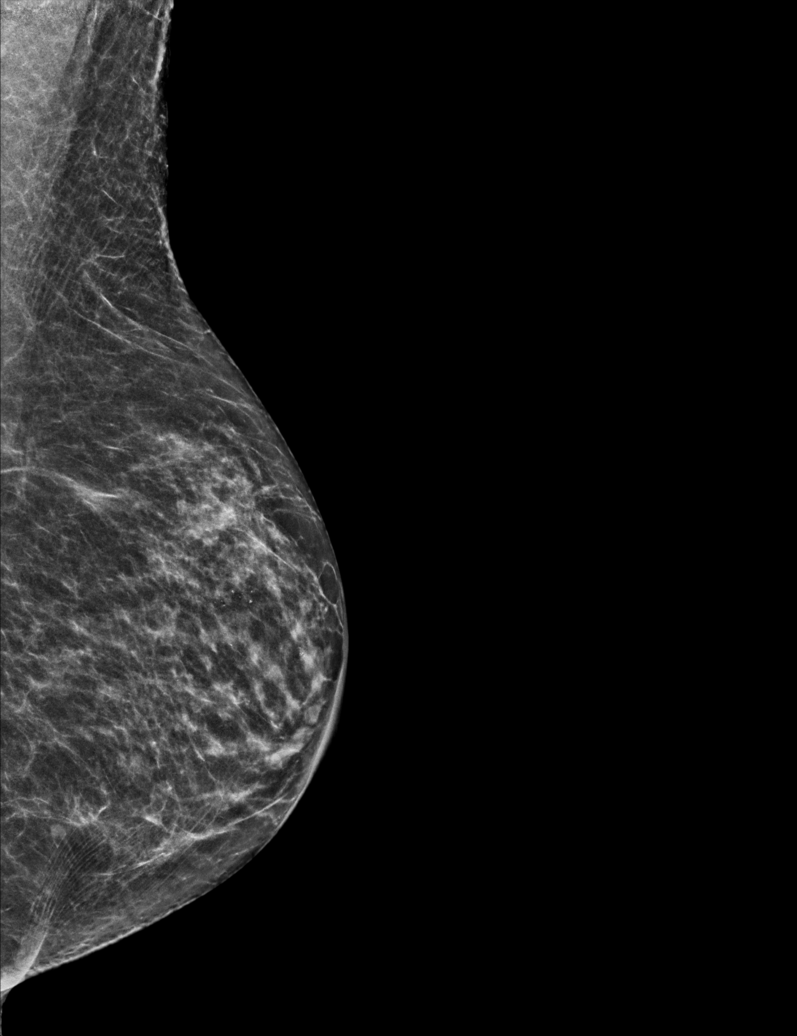

[L CC tomo · tomo slice 31/61.0]
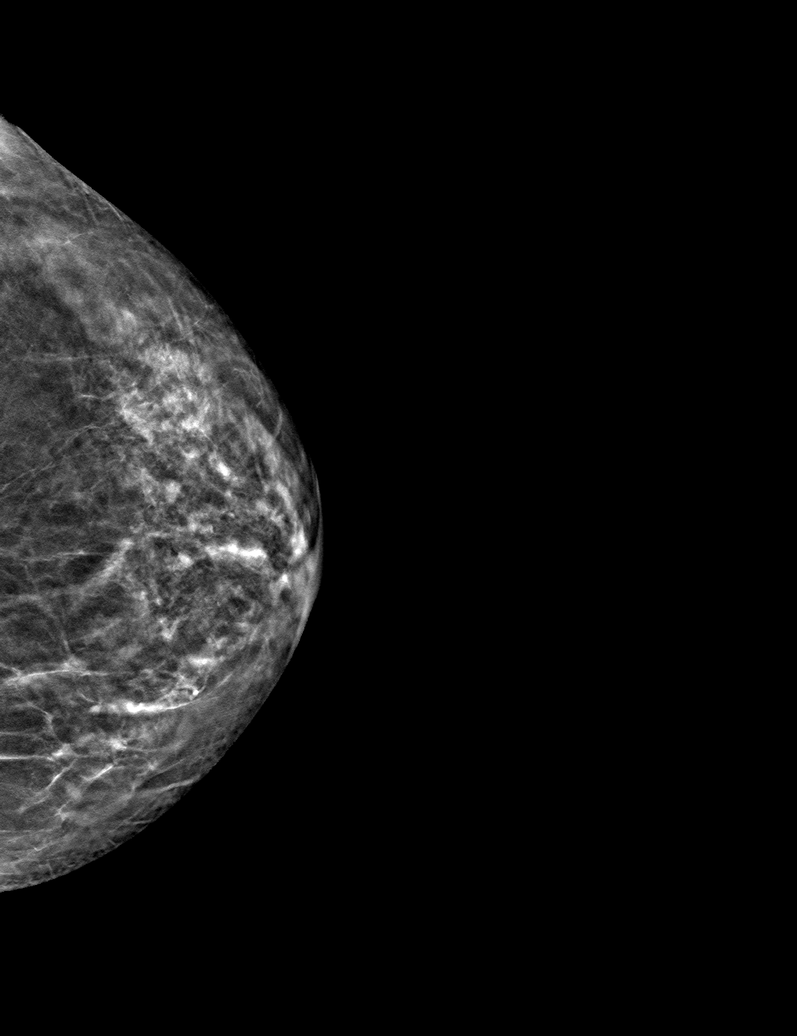

[L MLO tomo (1 of 2) · tomo slice 30/59.0]
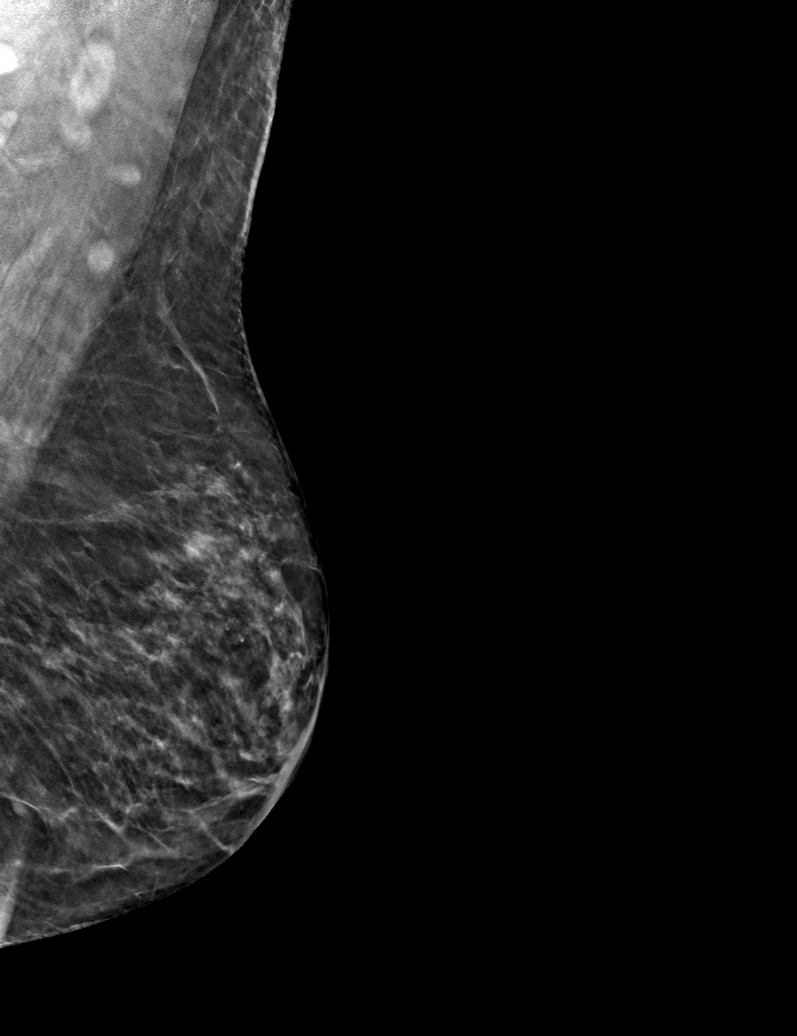

[L MLO tomo (2 of 2) · tomo slice 25/49.0]
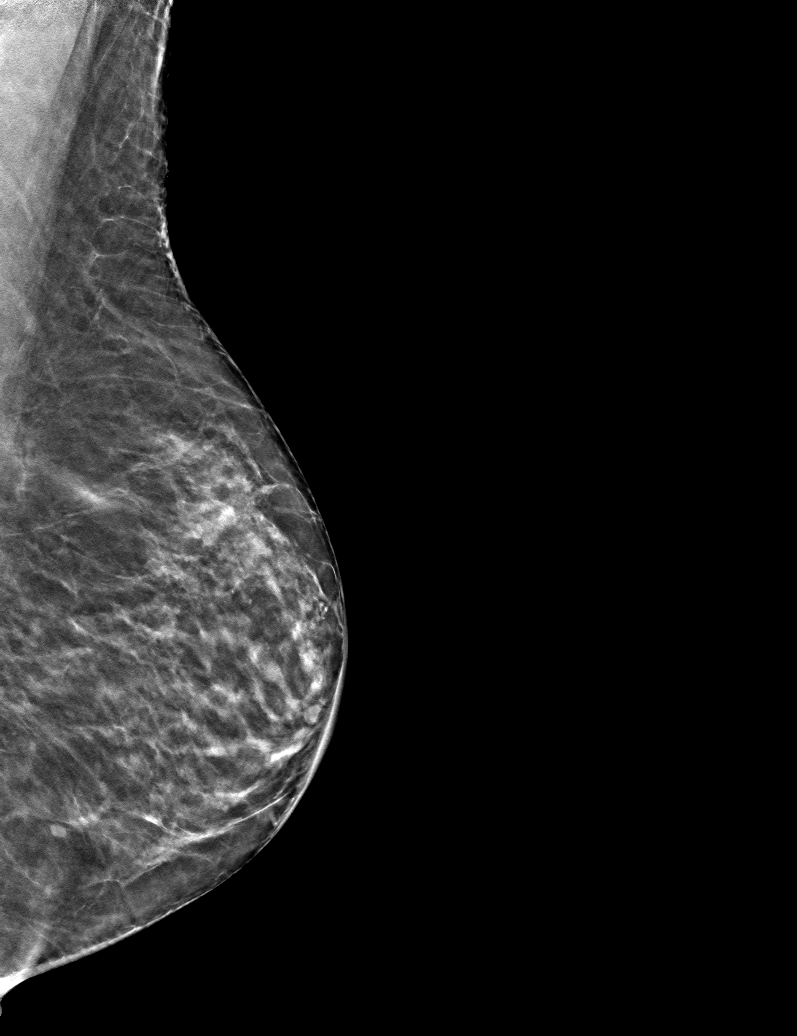

[6 of 18 positions shown; findings below may reference images not displayed]

ACR Breast Density Category c: The breast tissue is heterogeneously
dense, which may obscure small masses.
FINDINGS: There are no findings suspicious for malignancy. Images were
processed with CAD.
IMPRESSION: No mammographic evidence of malignancy. A result letter of this
screening mammogram will be mailed directly to the patient.

RECOMMENDATION:
Screening mammogram in one year. (Code:YR-2-EWS)

BI-RADS CATEGORY  1: Negative.

## 2020-09-26 ENCOUNTER — Other Ambulatory Visit: Payer: Self-pay | Admitting: Family Medicine

## 2020-12-03 ENCOUNTER — Other Ambulatory Visit: Payer: Self-pay | Admitting: Family Medicine

## 2020-12-03 DIAGNOSIS — I10 Essential (primary) hypertension: Secondary | ICD-10-CM

## 2020-12-03 NOTE — Telephone Encounter (Signed)
Requested Prescriptions  Pending Prescriptions Disp Refills  . lisinopril-hydrochlorothiazide (ZESTORETIC) 20-12.5 MG tablet [Pharmacy Med Name: LISINOPRIL-HCTZ 20-12.5 MG TAB] 90 tablet 0    Sig: TAKE 1 TABLET BY MOUTH EVERY DAY     Cardiovascular:  ACEI + Diuretic Combos Passed - 12/03/2020  5:15 PM      Passed - Na in normal range and within 180 days    Sodium  Date Value Ref Range Status  06/15/2020 135 134 - 144 mmol/L Final         Passed - K in normal range and within 180 days    Potassium  Date Value Ref Range Status  06/15/2020 4.6 3.5 - 5.2 mmol/L Final         Passed - Cr in normal range and within 180 days    Creatinine, Ser  Date Value Ref Range Status  06/15/2020 0.59 0.57 - 1.00 mg/dL Final         Passed - Ca in normal range and within 180 days    Calcium  Date Value Ref Range Status  06/15/2020 9.2 8.7 - 10.3 mg/dL Final         Passed - Patient is not pregnant      Passed - Last BP in normal range    BP Readings from Last 1 Encounters:  06/13/20 137/81         Passed - Valid encounter within last 6 months    Recent Outpatient Visits          5 months ago Need for influenza vaccination   [facility] Birdie Sons, MD   11 months ago Benign hypertension   [facility] Birdie Sons, MD   1 year ago Benign hypertension   [facility] Birdie Sons, MD   1 year ago Urinary frequency   [facility] Birdie Sons, MD   1 year ago Benign hypertension   [place], Kirstie Peri, MD      Future Appointments            Tomorrow Caryn Section, Kirstie Peri, MD [facility]

## 2020-12-04 ENCOUNTER — Other Ambulatory Visit: Payer: Self-pay

## 2020-12-04 ENCOUNTER — Ambulatory Visit (INDEPENDENT_AMBULATORY_CARE_PROVIDER_SITE_OTHER): Payer: Medicare HMO | Admitting: Family Medicine

## 2020-12-04 ENCOUNTER — Encounter: Payer: Self-pay | Admitting: Family Medicine

## 2020-12-04 VITALS — BP 138/70 | HR 69 | Ht 66.0 in | Wt 134.6 lb

## 2020-12-04 DIAGNOSIS — Z23 Encounter for immunization: Secondary | ICD-10-CM

## 2020-12-04 DIAGNOSIS — E785 Hyperlipidemia, unspecified: Secondary | ICD-10-CM

## 2020-12-04 DIAGNOSIS — I10 Essential (primary) hypertension: Secondary | ICD-10-CM | POA: Diagnosis not present

## 2020-12-04 MED ORDER — TETANUS-DIPHTH-ACELL PERTUSSIS 5-2.5-18.5 LF-MCG/0.5 IM SUSY: 0.5000 mL | PREFILLED_SYRINGE | Freq: Once | INTRAMUSCULAR | 0 refills | Status: AC

## 2020-12-04 MED ORDER — METOPROLOL SUCCINATE ER 100 MG PO TB24: 100.0000 mg | ORAL_TABLET | Freq: Every day | ORAL | 4 refills | Status: DC

## 2020-12-04 NOTE — Progress Notes (Signed)
Established patient visit   Patient: Michelle Parrish   DOB: 06/03/1941   80 y.o. Female  MRN: [id number] Visit Date: 12/04/2020  Today's healthcare provider: Lelon Huh, MD   Chief Complaint  Patient presents with  . Hypertension  . Hyperlipidemia   Subjective    HPI   Patient needs a refill on her medications.  Hypertension, follow-up  BP Readings from Last 3 Encounters:  06/13/20 137/81  12/14/19 126/82  08/03/19 130/80   Wt Readings from Last 3 Encounters:  06/13/20 135 lb (61.2 kg)  12/14/19 136 lb (61.7 kg)  08/03/19 142 lb (64.4 kg)     She was last seen for hypertension 6 months ago.  BP at that visit was 137/81. Management since that visit includes none; continue current medications.  She reports excellent compliance with treatment. She is not having side effects.  She is following a Regular diet. -  Well balanced and healthy She is exercising. - walks daiy 20 min  She does not smoke.  Use of agents associated with hypertension: none.   Outside blood pressures are 120s/80s.  Pertinent labs: Lab Results  Component Value Date   CHOL 194 06/15/2020   HDL 47 06/15/2020   LDLCALC 126 (H) 06/15/2020   TRIG 119 06/15/2020   CHOLHDL 4.1 06/15/2020   Lab Results  Component Value Date   NA 135 06/15/2020   K 4.6 06/15/2020   CREATININE 0.59 06/15/2020   GFRNONAA 89 06/15/2020   GFRAA 102 06/15/2020   GLUCOSE 82 06/15/2020     The 10-year ASCVD risk score Mikey Bussing DC Jr., et al., 2013) is: 22.6%   --------------------------------------------------------------------------------------------------- Lipid/Cholesterol, Follow-up  Last lipid panel Other pertinent labs  Lab Results  Component Value Date   CHOL 194 06/15/2020   HDL 47 06/15/2020   LDLCALC 126 (H) 06/15/2020   TRIG 119 06/15/2020   CHOLHDL 4.1 06/15/2020   Lab Results  Component Value Date   ALT 9 06/15/2020   AST 16 06/15/2020   PLT 374 06/15/2020   TSH 2.500 12/14/2019      She was last seen for this 6 months ago.  Management since that visit includes low saturated diet;not taking medications due to intolerance.  Current diet: in general, a "healthy" diet   Current exercise: walking  The 10-year ASCVD risk score Mikey Bussing DC Jr., et al., 2013) is: 22.6%  ---------------------------------------------------------------------------------------------------     Medications: Outpatient Medications Prior to Visit  Medication Sig  . ADVAIR DISKUS 100-50 MCG/DOSE AEPB TAKE 1 PUFF BY MOUTH TWICE A DAY  . albuterol (PROAIR HFA) 108 (90 Base) MCG/ACT inhaler Inhale 2 puffs into the lungs every 6 (six) hours as needed for wheezing or shortness of breath. SEASONALLY FOR ASTHMA  . amLODipine (NORVASC) 5 MG tablet TAKE 1 TABLET BY MOUTH EVERY DAY  . EPIPEN 2-PAK 0.3 MG/0.3ML SOAJ injection INJECT INTO THIGH MUSCLE AS NEEDED FOR ANAPHYLAXIS.  Marland Kitchen gabapentin (NEURONTIN) 100 MG capsule Take 2 capsules by mouth Nightly.  Marland Kitchen lisinopril-hydrochlorothiazide (ZESTORETIC) 20-12.5 MG tablet TAKE 1 TABLET BY MOUTH EVERY DAY  . metoprolol succinate (TOPROL-XL) 100 MG 24 hr tablet TAKE 1 TABLET BY MOUTH EVERY DAY  . montelukast (SINGULAIR) 10 MG tablet TAKE 1 TABLET BY MOUTH EVERY DAY  . Multiple Vitamin (MULTIVITAMIN) tablet Take 1 tablet by mouth daily.  . risedronate (ACTONEL) 35 MG tablet TAKE 1 TAB BY MOUTH EVERY 7 DAYS WITH WATER ON EMPTY STOMACH, DO NOT LIE DOWN FOR 30MIN AFTER  .  Vitamin D, Ergocalciferol, (DRISDOL) 1.25 MG (50000 UNIT) CAPS capsule Take 1 capsule (50,000 Units total) by mouth every 30 (thirty) days.   No facility-administered medications prior to visit.        Objective    BP 138/70   Pulse 69   Ht 5\' 6"  (1.676 m)   Wt 134 lb 9.6 oz (61.1 kg)   SpO2 100%   BMI 21.73 kg/m     Physical Exam    General appearance: Well developed, well nourished female, cooperative and in no acute distress Head: Normocephalic, without obvious abnormality,  atraumatic Respiratory: Respirations even and unlabored, normal respiratory rate Extremities: All extremities are intact.  Skin: Skin color, texture, turgor normal. No rashes seen  Psych: Appropriate mood and affect. Neurologic: Mental status: Alert, oriented to person, place, and time, thought content appropriate.   No results found for any visits on 12/04/20.  Assessment & Plan     1. Benign hypertension Well controlled.  Continue current medications.    2. Hyperlipidemia, unspecified hyperlipidemia type Intolerant to statins, but doing well with diet.   3. Prescription for Tdap. Vaccine not administered in office.   - Tdap ([place]) 5-2.5-18.5 LF-MCG/0.5 injection; Inject 0.5 mLs into the muscle once for 1 dose.  Dispense: 0.5 mL; Refill: 0   Follow up BP check and labs in 6 months.       The entirety of the information documented in the History of Present Illness, Review of Systems and Physical Exam were personally obtained by me. Portions of this information were initially documented by the CMA and reviewed by me for thoroughness and accuracy.      Lelon Huh, MD  [facility] [phone] (phone) [phone] (fax)  [place]

## 2020-12-04 NOTE — Patient Instructions (Signed)
.   Please review the attached list of medications and notify my office if there are any errors.   . Please bring all of your medications to every appointment so we can make sure that our medication list is the same as yours.   

## 2020-12-11 DIAGNOSIS — G8929 Other chronic pain: Secondary | ICD-10-CM | POA: Diagnosis not present

## 2020-12-11 DIAGNOSIS — G43411 Hemiplegic migraine, intractable, with status migrainosus: Secondary | ICD-10-CM | POA: Diagnosis not present

## 2020-12-11 DIAGNOSIS — M5441 Lumbago with sciatica, right side: Secondary | ICD-10-CM | POA: Diagnosis not present

## 2020-12-11 DIAGNOSIS — H8113 Benign paroxysmal vertigo, bilateral: Secondary | ICD-10-CM | POA: Diagnosis not present

## 2020-12-20 ENCOUNTER — Other Ambulatory Visit: Payer: Self-pay | Admitting: Family Medicine

## 2020-12-20 DIAGNOSIS — I10 Essential (primary) hypertension: Secondary | ICD-10-CM

## 2020-12-20 MED ORDER — LISINOPRIL-HYDROCHLOROTHIAZIDE 20-12.5 MG PO TABS: 1.0000 | ORAL_TABLET | Freq: Every day | ORAL | 4 refills | Status: DC

## 2021-01-09 DIAGNOSIS — M898X9 Other specified disorders of bone, unspecified site: Secondary | ICD-10-CM | POA: Diagnosis not present

## 2021-01-09 DIAGNOSIS — M2041 Other hammer toe(s) (acquired), right foot: Secondary | ICD-10-CM | POA: Diagnosis not present

## 2021-01-09 DIAGNOSIS — M778 Other enthesopathies, not elsewhere classified: Secondary | ICD-10-CM | POA: Diagnosis not present

## 2021-01-16 ENCOUNTER — Encounter: Payer: Self-pay | Admitting: Family Medicine

## 2021-01-16 ENCOUNTER — Ambulatory Visit (INDEPENDENT_AMBULATORY_CARE_PROVIDER_SITE_OTHER): Payer: Medicare HMO | Admitting: Family Medicine

## 2021-01-16 ENCOUNTER — Other Ambulatory Visit: Payer: Self-pay

## 2021-01-16 VITALS — BP 134/79 | HR 76 | Temp 97.5°F | Resp 16 | Wt 138.0 lb

## 2021-01-16 DIAGNOSIS — R300 Dysuria: Secondary | ICD-10-CM

## 2021-01-16 LAB — POCT URINALYSIS DIPSTICK
Bilirubin, UA: NEGATIVE
Blood, UA: NEGATIVE
Glucose, UA: NEGATIVE
Ketones, UA: NEGATIVE
Leukocytes, UA: NEGATIVE
Nitrite, UA: NEGATIVE
Protein, UA: NEGATIVE
Spec Grav, UA: 1.01 (ref 1.010–1.025)
Urobilinogen, UA: 0.2 E.U./dL
pH, UA: 7 (ref 5.0–8.0)

## 2021-01-16 NOTE — Progress Notes (Signed)
Established patient visit   Patient: Michelle Parrish   DOB: 12-31-1940   80 y.o. Female  MRN: [id number] Visit Date: 01/16/2021  Today's healthcare provider: Lelon Huh, MD   Chief Complaint  Patient presents with  . Dysuria   Subjective    Dysuria  This is a new problem. Episode onset: 4 days ago. The quality of the pain is described as burning. Associated symptoms include chills, frequency and urgency. Pertinent negatives include no flank pain, hematuria, nausea, sweats or vomiting. Treatments tried: Urostat. The treatment provided mild relief.        Medications: Outpatient Medications Prior to Visit  Medication Sig  . ADVAIR DISKUS 100-50 MCG/DOSE AEPB TAKE 1 PUFF BY MOUTH TWICE A DAY  . albuterol (PROAIR HFA) 108 (90 Base) MCG/ACT inhaler Inhale 2 puffs into the lungs every 6 (six) hours as needed for wheezing or shortness of breath. SEASONALLY FOR ASTHMA  . amLODipine (NORVASC) 5 MG tablet TAKE 1 TABLET BY MOUTH EVERY DAY  . EPIPEN 2-PAK 0.3 MG/0.3ML SOAJ injection INJECT INTO THIGH MUSCLE AS NEEDED FOR ANAPHYLAXIS.  Marland Kitchen gabapentin (NEURONTIN) 100 MG capsule Take 2 capsules by mouth Nightly.  Marland Kitchen lisinopril-hydrochlorothiazide (ZESTORETIC) 20-12.5 MG tablet Take 1 tablet by mouth daily.  . metoprolol succinate (TOPROL-XL) 100 MG 24 hr tablet Take 1 tablet (100 mg total) by mouth daily. Take with or immediately following a meal.  . montelukast (SINGULAIR) 10 MG tablet TAKE 1 TABLET BY MOUTH EVERY DAY  . Multiple Vitamin (MULTIVITAMIN) tablet Take 1 tablet by mouth daily.  . risedronate (ACTONEL) 35 MG tablet TAKE 1 TAB BY MOUTH EVERY 7 DAYS WITH WATER ON EMPTY STOMACH, DO NOT LIE DOWN FOR 30MIN AFTER  . Vitamin D, Ergocalciferol, (DRISDOL) 1.25 MG (50000 UNIT) CAPS capsule Take 1 capsule (50,000 Units total) by mouth every 30 (thirty) days.   No facility-administered medications prior to visit.    Review of Systems  Constitutional: Positive for chills. Negative for  appetite change, fatigue and fever.  Respiratory: Negative for chest tightness and shortness of breath.   Cardiovascular: Negative for chest pain and palpitations.  Gastrointestinal: Negative for abdominal pain, nausea and vomiting.  Genitourinary: Positive for dysuria, frequency and urgency. Negative for flank pain and hematuria.       Pelvic pressure  Neurological: Negative for dizziness and weakness.       Objective    BP 134/79 (BP Location: Left Arm, Patient Position: Sitting, Cuff Size: Normal)   Pulse 76   Temp (!) 97.5 F (36.4 C) (Temporal)   Resp 16   Wt 138 lb (62.6 kg)   BMI 22.27 kg/m     Physical Exam   Results for orders placed or performed in visit on 01/16/21  POCT Urinalysis Dipstick  Result Value Ref Range   Color, UA yellow    Clarity, UA clear    Glucose, UA Negative Negative   Bilirubin, UA negative    Ketones, UA negative    Spec Grav, UA 1.010 1.010 - 1.025   Blood, UA negative    pH, UA 7.0 5.0 - 8.0   Protein, UA Negative Negative   Urobilinogen, UA 0.2 0.2 or 1.0 E.U./dL   Nitrite, UA negative    Leukocytes, UA Negative Negative   Appearance     Odor      Assessment & Plan     1. Dysuria Likely mild UTI which is resolving without treatment.  - Urine Culture  The entirety of the information documented in the History of Present Illness, Review of Systems and Physical Exam were personally obtained by me. Portions of this information were initially documented by the CMA and reviewed by me for thoroughness and accuracy.      Lelon Huh, MD  [facility] [phone] (phone) [phone] (fax)  [place]

## 2021-01-19 LAB — URINE CULTURE

## 2021-01-29 DIAGNOSIS — M47816 Spondylosis without myelopathy or radiculopathy, lumbar region: Secondary | ICD-10-CM | POA: Diagnosis not present

## 2021-01-29 DIAGNOSIS — M791 Myalgia, unspecified site: Secondary | ICD-10-CM | POA: Diagnosis not present

## 2021-01-29 DIAGNOSIS — M159 Polyosteoarthritis, unspecified: Secondary | ICD-10-CM | POA: Diagnosis not present

## 2021-01-29 DIAGNOSIS — L309 Dermatitis, unspecified: Secondary | ICD-10-CM | POA: Diagnosis not present

## 2021-02-01 DIAGNOSIS — D2271 Melanocytic nevi of right lower limb, including hip: Secondary | ICD-10-CM | POA: Diagnosis not present

## 2021-02-01 DIAGNOSIS — D2272 Melanocytic nevi of left lower limb, including hip: Secondary | ICD-10-CM | POA: Diagnosis not present

## 2021-02-01 DIAGNOSIS — L249 Irritant contact dermatitis, unspecified cause: Secondary | ICD-10-CM | POA: Diagnosis not present

## 2021-02-01 DIAGNOSIS — Z85828 Personal history of other malignant neoplasm of skin: Secondary | ICD-10-CM | POA: Diagnosis not present

## 2021-02-01 DIAGNOSIS — X32XXXA Exposure to sunlight, initial encounter: Secondary | ICD-10-CM | POA: Diagnosis not present

## 2021-02-01 DIAGNOSIS — D2262 Melanocytic nevi of left upper limb, including shoulder: Secondary | ICD-10-CM | POA: Diagnosis not present

## 2021-02-01 DIAGNOSIS — L570 Actinic keratosis: Secondary | ICD-10-CM | POA: Diagnosis not present

## 2021-02-01 DIAGNOSIS — D2261 Melanocytic nevi of right upper limb, including shoulder: Secondary | ICD-10-CM | POA: Diagnosis not present

## 2021-02-23 ENCOUNTER — Telehealth: Payer: Self-pay

## 2021-02-23 ENCOUNTER — Other Ambulatory Visit: Payer: Self-pay

## 2021-02-23 ENCOUNTER — Ambulatory Visit (INDEPENDENT_AMBULATORY_CARE_PROVIDER_SITE_OTHER): Payer: Medicare HMO | Admitting: Family Medicine

## 2021-02-23 ENCOUNTER — Encounter: Payer: Self-pay | Admitting: Family Medicine

## 2021-02-23 VITALS — BP 146/76 | HR 80 | Temp 98.7°F | Resp 16 | Ht 66.0 in | Wt 135.0 lb

## 2021-02-23 DIAGNOSIS — R300 Dysuria: Secondary | ICD-10-CM

## 2021-02-23 DIAGNOSIS — N390 Urinary tract infection, site not specified: Secondary | ICD-10-CM

## 2021-02-23 LAB — POCT URINALYSIS DIPSTICK
Bilirubin, UA: NEGATIVE
Glucose, UA: NEGATIVE
Ketones, UA: NEGATIVE
Nitrite, UA: POSITIVE
Protein, UA: NEGATIVE
Spec Grav, UA: 1.01 (ref 1.010–1.025)
Urobilinogen, UA: 0.2 E.U./dL
pH, UA: 7 (ref 5.0–8.0)

## 2021-02-23 MED ORDER — NITROFURANTOIN MONOHYD MACRO 100 MG PO CAPS
100.0000 mg | ORAL_CAPSULE | Freq: Two times a day (BID) | ORAL | 0 refills | Status: DC
Start: 2021-02-23 — End: 2021-03-01

## 2021-02-23 MED ORDER — CEPHALEXIN 500 MG PO CAPS: 500.0000 mg | ORAL_CAPSULE | Freq: Two times a day (BID) | ORAL | 0 refills | Status: AC

## 2021-02-23 NOTE — Telephone Encounter (Signed)
Have sent prescription for cephalexin to [facility]. Call if not much better Monday.

## 2021-02-23 NOTE — Telephone Encounter (Signed)
Copied from [place] [phone]. Topic: General - Other >> Feb 23, 2021  9:20 AM Leward Quan A wrote: Reason for CRM: Patient called in to inform Dr Caryn Section that she have a UTI did a home test that showed an infection. Had a fever temp 990.2, was nauseous and have burning during urination. Asking for some help today please Ph# [phone]
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
## 2021-02-23 NOTE — Telephone Encounter (Signed)
Patient advised. She states she was seen in the office today by Vernie Murders, PA-C.

## 2021-02-23 NOTE — Telephone Encounter (Signed)
Please review.  Do you need me to refer pt to an urgent care?  Thanks,   -Mickel Baas

## 2021-02-27 ENCOUNTER — Telehealth: Payer: Self-pay

## 2021-02-27 NOTE — Telephone Encounter (Signed)
Copied from [place] [id number]. Topic: General - Inquiry >> Feb 27, 2021  9:46 AM Greggory Keen D wrote: Reason for CRM: Pt called asking about the results from her UC.  CB#  [phone]

## 2021-02-27 NOTE — Telephone Encounter (Signed)
Preliminary report of urine culture isolating a gram negative rod. Awaiting final report. Continue the Macrobid.

## 2021-02-28 ENCOUNTER — Encounter (INDEPENDENT_AMBULATORY_CARE_PROVIDER_SITE_OTHER): Payer: Medicare HMO | Admitting: Family Medicine

## 2021-02-28 ENCOUNTER — Other Ambulatory Visit: Payer: Self-pay | Admitting: Family Medicine

## 2021-02-28 DIAGNOSIS — R300 Dysuria: Secondary | ICD-10-CM | POA: Diagnosis not present

## 2021-02-28 LAB — URINE CULTURE

## 2021-03-01 ENCOUNTER — Other Ambulatory Visit: Payer: Self-pay | Admitting: Family Medicine

## 2021-03-01 DIAGNOSIS — N390 Urinary tract infection, site not specified: Secondary | ICD-10-CM

## 2021-03-01 MED ORDER — CEFUROXIME AXETIL 250 MG PO TABS: 250.0000 mg | ORAL_TABLET | Freq: Two times a day (BID) | ORAL | 0 refills | Status: AC

## 2021-03-19 ENCOUNTER — Telehealth: Payer: Self-pay

## 2021-03-19 NOTE — Telephone Encounter (Signed)
Copied from [place] [id number]. Topic: Appointment Scheduling - Scheduling Inquiry for Clinic >> Mar 19, 2021  3:00 PM Erick Blinks wrote: Reason for CRM: Pt wants lab work to follow up on UTI. Please advise   Best contact: [phone]

## 2021-03-19 NOTE — Telephone Encounter (Signed)
Please advise 

## 2021-03-20 NOTE — Telephone Encounter (Signed)
OK 

## 2021-03-20 NOTE — Telephone Encounter (Signed)
Pt saw Simona Huh 02/23/2021 for a UTI.  Pt states Simona Huh wanted to repeat her culture to make sure it was completely cleared.  She states she finished the antibiotic last week and her symptoms have resolved.  She is wanting to come in to leave a urine sample instead of making a follow up appointment.  Please advise if that is okay.   Thanks,   -Mickel Baas

## 2021-03-20 NOTE — Telephone Encounter (Signed)
Need more info. Is she still having symptoms? If so can repeat u/a and culture

## 2021-03-22 ENCOUNTER — Other Ambulatory Visit: Payer: Self-pay | Admitting: Family Medicine

## 2021-03-22 DIAGNOSIS — J45909 Unspecified asthma, uncomplicated: Secondary | ICD-10-CM

## 2021-03-22 LAB — POCT URINALYSIS DIPSTICK
Bilirubin, UA: NEGATIVE
Blood, UA: NEGATIVE
Glucose, UA: NEGATIVE
Ketones, UA: NEGATIVE
Leukocytes, UA: NEGATIVE
Nitrite, UA: NEGATIVE
Protein, UA: NEGATIVE
Spec Grav, UA: 1.015 (ref 1.010–1.025)
Urobilinogen, UA: 0.2 E.U./dL
pH, UA: 7 (ref 5.0–8.0)

## 2021-03-22 MED ORDER — FLUTICASONE-SALMETEROL 100-50 MCG/ACT IN AEPB: 1.0000 | INHALATION_SPRAY | Freq: Two times a day (BID) | RESPIRATORY_TRACT | 12 refills | Status: DC

## 2021-03-25 LAB — URINE CULTURE

## 2021-04-06 ENCOUNTER — Telehealth: Payer: Self-pay | Admitting: *Deleted

## 2021-04-06 NOTE — Chronic Care Management (AMB) (Signed)
  Chronic Care Management   Note  04/06/2021 Name: Michelle Parrish MRN: [id number] DOB: 11-Aug-1941  Michelle Parrish is a 80 y.o. year old female who is a primary care patient of Caryn Section, Kirstie Peri, MD. I reached out to [name] by phone today in response to a referral sent by [name] PCP Birdie Sons, MD     [name] was given information about Chronic Care Management services today including:  CCM service includes personalized support from designated clinical staff supervised by her physician, including individualized plan of care and coordination with other care providers 24/7 contact phone numbers for assistance for urgent and routine care needs. Service will only be billed when office clinical staff spend 20 minutes or more in a month to coordinate care. Only one practitioner may furnish and bill the service in a calendar month. The patient may stop CCM services at any time (effective at the end of the month) by phone call to the office staff. The patient will be responsible for cost sharing (co-pay) of up to 20% of the service fee (after annual deductible is met).  Patient did not agree to enrollment in care management services and does not wish to consider at this time.  Follow up plan: Patient declines further follow up and engagement by the care management team. Appropriate care team members and provider have been notified via electronic communication.   Julian Hy, [facility]  Direct Dial: [phone]

## 2021-04-26 DIAGNOSIS — E237 Disorder of pituitary gland, unspecified: Secondary | ICD-10-CM | POA: Diagnosis not present

## 2021-05-28 DIAGNOSIS — R011 Cardiac murmur, unspecified: Secondary | ICD-10-CM | POA: Diagnosis not present

## 2021-05-28 DIAGNOSIS — E785 Hyperlipidemia, unspecified: Secondary | ICD-10-CM | POA: Diagnosis not present

## 2021-05-28 DIAGNOSIS — I10 Essential (primary) hypertension: Secondary | ICD-10-CM | POA: Diagnosis not present

## 2021-05-28 DIAGNOSIS — R002 Palpitations: Secondary | ICD-10-CM | POA: Diagnosis not present

## 2021-05-28 DIAGNOSIS — I739 Peripheral vascular disease, unspecified: Secondary | ICD-10-CM | POA: Diagnosis not present

## 2021-05-30 ENCOUNTER — Other Ambulatory Visit: Payer: Self-pay | Admitting: Family Medicine

## 2021-05-30 DIAGNOSIS — Z1231 Encounter for screening mammogram for malignant neoplasm of breast: Secondary | ICD-10-CM

## 2021-06-01 ENCOUNTER — Other Ambulatory Visit: Payer: Self-pay

## 2021-06-01 ENCOUNTER — Ambulatory Visit
Admission: RE | Admit: 2021-06-01 | Discharge: 2021-06-01 | Disposition: A | Discharge status: Home / Self Care | Payer: Medicare HMO | Source: Ambulatory Visit | Attending: Family Medicine | Admitting: Family Medicine

## 2021-06-01 DIAGNOSIS — Z1231 Encounter for screening mammogram for malignant neoplasm of breast: Secondary | ICD-10-CM | POA: Diagnosis not present

## 2021-06-11 ENCOUNTER — Ambulatory Visit (INDEPENDENT_AMBULATORY_CARE_PROVIDER_SITE_OTHER): Payer: Medicare HMO | Admitting: Family Medicine

## 2021-06-11 ENCOUNTER — Other Ambulatory Visit: Payer: Self-pay

## 2021-06-11 ENCOUNTER — Encounter: Payer: Self-pay | Admitting: Family Medicine

## 2021-06-11 VITALS — BP 136/70 | HR 72 | Temp 97.9°F | Resp 18 | Wt 133.0 lb

## 2021-06-11 DIAGNOSIS — E785 Hyperlipidemia, unspecified: Secondary | ICD-10-CM

## 2021-06-11 DIAGNOSIS — Z23 Encounter for immunization: Secondary | ICD-10-CM | POA: Diagnosis not present

## 2021-06-11 DIAGNOSIS — I10 Essential (primary) hypertension: Secondary | ICD-10-CM | POA: Diagnosis not present

## 2021-06-11 DIAGNOSIS — M810 Age-related osteoporosis without current pathological fracture: Secondary | ICD-10-CM | POA: Diagnosis not present

## 2021-06-11 DIAGNOSIS — N390 Urinary tract infection, site not specified: Secondary | ICD-10-CM | POA: Diagnosis not present

## 2021-06-11 LAB — POCT URINALYSIS DIPSTICK
Bilirubin, UA: NEGATIVE
Glucose, UA: NEGATIVE
Ketones, UA: NEGATIVE
Leukocytes, UA: NEGATIVE
Nitrite, UA: NEGATIVE
Protein, UA: NEGATIVE
Spec Grav, UA: 1.01 (ref 1.010–1.025)
Urobilinogen, UA: 0.2 E.U./dL
pH, UA: 7.5 (ref 5.0–8.0)

## 2021-06-11 MED ORDER — RISEDRONATE SODIUM 35 MG PO TABS: ORAL_TABLET | ORAL | 4 refills | Status: DC

## 2021-06-11 NOTE — Progress Notes (Signed)
Established patient visit   Patient: Michelle Parrish   DOB: Jan 17, 1941   80 y.o. Female  MRN: [id number] Visit Date: 06/11/2021  Today's healthcare provider: Lelon Huh, MD   Chief Complaint  Patient presents with   Hypertension   Hyperlipidemia   Subjective    HPI  Hypertension, follow-up  BP Readings from Last 3 Encounters:  02/23/21 (!) 146/76  01/16/21 134/79  12/04/20 138/70   Wt Readings from Last 3 Encounters:  02/23/21 135 lb (61.2 kg)  01/16/21 138 lb (62.6 kg)  12/04/20 134 lb 9.6 oz (61.1 kg)     She was last seen for hypertension 6 months ago.  BP at that visit was 138/70. Management since that visit includes no changes.  She reports excellent compliance with treatment. She is not having side effects.  She is following a Regular diet. She is exercising. She does not smoke.  Use of agents associated with hypertension: none.   Outside blood pressures are checked and average 110/72.    --------------------------------------------------------------------------------------------------- Lipid/Cholesterol, Follow-up  Last lipid panel Other pertinent labs  Lab Results  Component Value Date   CHOL 194 06/15/2020   HDL 47 06/15/2020   LDLCALC 126 (H) 06/15/2020   TRIG 119 06/15/2020   CHOLHDL 4.1 06/15/2020   Lab Results  Component Value Date   ALT 9 06/15/2020   AST 16 06/15/2020   PLT 374 06/15/2020   TSH 2.500 12/14/2019     She was last seen for this 12 months ago.  Management since that visit includes no changes.  She reports good compliance with treatment. She is not having side effects.   Symptoms: No chest pain No chest pressure/discomfort  No dyspnea No lower extremity edema  No numbness or tingling of extremity No orthopnea  No palpitations No paroxysmal nocturnal dyspnea  No speech difficulty No syncope    The 10-year ASCVD risk score (Arnett DK, et al., 2019) is:  28.5%  ---------------------------------------------------------------------------------------------------     Medications: Outpatient Medications Prior to Visit  Medication Sig   albuterol (PROAIR HFA) 108 (90 Base) MCG/ACT inhaler Inhale 2 puffs into the lungs every 6 (six) hours as needed for wheezing or shortness of breath. SEASONALLY FOR ASTHMA   amLODipine (NORVASC) 5 MG tablet TAKE 1 TABLET BY MOUTH EVERY DAY   EPIPEN 2-PAK 0.3 MG/0.3ML SOAJ injection INJECT INTO THIGH MUSCLE AS NEEDED FOR ANAPHYLAXIS.   fluticasone-salmeterol (ADVAIR) 100-50 MCG/ACT AEPB Inhale 1 puff into the lungs 2 (two) times daily.   gabapentin (NEURONTIN) 100 MG capsule Take 2 capsules by mouth Nightly.   lisinopril-hydrochlorothiazide (ZESTORETIC) 20-12.5 MG tablet Take 1 tablet by mouth daily.   metoprolol succinate (TOPROL-XL) 100 MG 24 hr tablet Take 1 tablet (100 mg total) by mouth daily. Take with or immediately following a meal.   montelukast (SINGULAIR) 10 MG tablet TAKE 1 TABLET BY MOUTH EVERY DAY   Multiple Vitamin (MULTIVITAMIN) tablet Take 1 tablet by mouth daily.   risedronate (ACTONEL) 35 MG tablet TAKE 1 TAB BY MOUTH EVERY 7 DAYS WITH WATER ON EMPTY STOMACH, DO NOT LIE DOWN FOR 30MIN AFTER   Vitamin D, Ergocalciferol, (DRISDOL) 1.25 MG (50000 UNIT) CAPS capsule Take 1 capsule (50,000 Units total) by mouth every 30 (thirty) days.   No facility-administered medications prior to visit.    Review of Systems  Constitutional:  Negative for appetite change, chills, fatigue and fever.  Respiratory:  Negative for chest tightness and shortness of breath.   Cardiovascular:  Negative for chest pain and palpitations.  Gastrointestinal:  Negative for abdominal pain, nausea and vomiting.  Musculoskeletal:  Positive for joint swelling (in ankles).  Neurological:  Negative for dizziness and weakness.      Objective    BP 136/70   Pulse 72   Temp 97.9 F (36.6 C) (Oral)   Resp 18   Wt 133 lb (60.3  kg)   SpO2 100% Comment: room air  BMI 21.47 kg/m    Physical Exam   General: Appearance:    Well developed, well nourished female in no acute distress  Eyes:    PERRL, conjunctiva/corneas clear, EOM's intact       Lungs:     Clear to auscultation bilaterally, respirations unlabored  Heart:    Normal heart rate. Normal rhythm.  2/6 mid-systolic murmur at right upper sternal border   MS:   All extremities are intact.    Neurologic:   Awake, alert, oriented x 3. No apparent focal neurological defect.         Assessment & Plan     1. Primary hypertension  - CBC - Comprehensive metabolic panel  2. Osteoporosis, unspecified osteoporosis type, unspecified pathological fracture presence  - VITAMIN D 25 Hydroxy (Vit-D Deficiency, Fractures) - risedronate (ACTONEL) 35 MG tablet; TAKE 1 TAB BY MOUTH EVERY 7 DAYS WITH WATER ON EMPTY STOMACH, DO NOT LIE DOWN FOR 30MIN AFTER  Dispense: 12 tablet; Refill: 4  3. Hyperlipidemia, unspecified hyperlipidemia type Diet controlled.  - Lipid panel  4. Urinary tract infection without hematuria, site unspecified Has completed antibiotic and concerned whether infection is completely gone.   5. Need for influenza vaccination  - Flu Vaccine QUAD High Dose IM (Fluad)        The entirety of the information documented in the History of Present Illness, Review of Systems and Physical Exam were personally obtained by me. Portions of this information were initially documented by the CMA and reviewed by me for thoroughness and accuracy.     Lelon Huh, MD  [facility] [phone] (phone) [phone] (fax)  [place]

## 2021-06-13 DIAGNOSIS — I10 Essential (primary) hypertension: Secondary | ICD-10-CM | POA: Diagnosis not present

## 2021-06-13 DIAGNOSIS — E785 Hyperlipidemia, unspecified: Secondary | ICD-10-CM | POA: Diagnosis not present

## 2021-06-13 DIAGNOSIS — M810 Age-related osteoporosis without current pathological fracture: Secondary | ICD-10-CM | POA: Diagnosis not present

## 2021-06-14 LAB — COMPREHENSIVE METABOLIC PANEL
ALT: 12 IU/L (ref 0–32)
AST: 20 IU/L (ref 0–40)
Albumin/Globulin Ratio: 1.9 (ref 1.2–2.2)
Albumin: 4.4 g/dL (ref 3.7–4.7)
Alkaline Phosphatase: 67 IU/L (ref 44–121)
BUN/Creatinine Ratio: 8 — ABNORMAL LOW (ref 12–28)
BUN: 6 mg/dL — ABNORMAL LOW (ref 8–27)
Bilirubin Total: 0.6 mg/dL (ref 0.0–1.2)
CO2: 24 mmol/L (ref 20–29)
Calcium: 9.3 mg/dL (ref 8.7–10.3)
Chloride: 100 mmol/L (ref 96–106)
Creatinine, Ser: 0.71 mg/dL (ref 0.57–1.00)
Globulin, Total: 2.3 g/dL (ref 1.5–4.5)
Glucose: 87 mg/dL (ref 70–99)
Potassium: 4.8 mmol/L (ref 3.5–5.2)
Sodium: 138 mmol/L (ref 134–144)
Total Protein: 6.7 g/dL (ref 6.0–8.5)
eGFR: 87 mL/min/{1.73_m2} (ref >59)

## 2021-06-14 LAB — CBC
Hematocrit: 45 % (ref 34.0–46.6)
Hemoglobin: 15.1 g/dL (ref 11.1–15.9)
MCH: 28.4 pg (ref 26.6–33.0)
MCHC: 33.6 g/dL (ref 31.5–35.7)
MCV: 85 fL (ref 79–97)
Platelets: 346 10*3/uL (ref 150–450)
RBC: 5.32 x10E6/uL — ABNORMAL HIGH (ref 3.77–5.28)
RDW: 11.9 % (ref 11.7–15.4)
WBC: 6.8 10*3/uL (ref 3.4–10.8)

## 2021-06-14 LAB — LIPID PANEL
Chol/HDL Ratio: 4 ratio (ref 0.0–4.4)
Cholesterol, Total: 206 mg/dL — ABNORMAL HIGH (ref 100–199)
HDL: 52 mg/dL (ref >39)
LDL Chol Calc (NIH): 130 mg/dL — ABNORMAL HIGH (ref 0–99)
Triglycerides: 136 mg/dL (ref 0–149)
VLDL Cholesterol Cal: 24 mg/dL (ref 5–40)

## 2021-06-14 LAB — VITAMIN D 25 HYDROXY (VIT D DEFICIENCY, FRACTURES): Vit D, 25-Hydroxy: 55.8 ng/mL (ref 30.0–100.0)

## 2021-06-28 ENCOUNTER — Other Ambulatory Visit: Payer: Self-pay | Admitting: Family Medicine

## 2021-06-28 DIAGNOSIS — J301 Allergic rhinitis due to pollen: Secondary | ICD-10-CM

## 2021-06-28 DIAGNOSIS — J45909 Unspecified asthma, uncomplicated: Secondary | ICD-10-CM

## 2021-06-28 DIAGNOSIS — I10 Essential (primary) hypertension: Secondary | ICD-10-CM

## 2021-06-28 NOTE — Telephone Encounter (Signed)
Requested Prescriptions  Pending Prescriptions Disp Refills  . amLODipine (NORVASC) 5 MG tablet [Pharmacy Med Name: AMLODIPINE BESYLATE 5 MG TAB] 90 tablet 1    Sig: TAKE 1 TABLET BY MOUTH EVERY DAY     Cardiovascular:  Calcium Channel Blockers Passed - 06/28/2021  1:22 PM      Passed - Last BP in normal range    BP Readings from Last 1 Encounters:  06/11/21 136/70         Passed - Valid encounter within last 6 months    Recent Outpatient Visits          2 weeks ago Primary hypertension   [facility] Birdie Sons, MD   4 months ago [place], Vickki Muff, PA-C   5 months ago [place], Donald E, MD   6 months ago Benign hypertension   [facility] Birdie Sons, MD   1 year ago Need for influenza vaccination   [facility] Birdie Sons, MD             . montelukast (SINGULAIR) 10 MG tablet [Pharmacy Med Name: MONTELUKAST SOD 10 MG TABLET] 90 tablet 3    Sig: TAKE 1 TABLET BY MOUTH EVERY DAY     Pulmonology:  Leukotriene Inhibitors Passed - 06/28/2021  1:22 PM      Passed - Valid encounter within last 12 months    Recent Outpatient Visits          2 weeks ago Primary hypertension   [facility] Birdie Sons, MD   4 months ago [place], Vickki Muff, PA-C   5 months ago [place], Donald E, MD   6 months ago Benign hypertension   [facility] Birdie Sons, MD   1 year ago Need for influenza vaccination   [facility] Birdie Sons, MD             . albuterol (VENTOLIN HFA) 108 (90 Base) MCG/ACT inhaler [Pharmacy Med Name: ALBUTEROL HFA (VENTOLIN) INH] 18 each 4    Sig: INHALE 2 PUFFS INTO THE LUNGS EVERY 6 (SIX) HOURS AS NEEDED FOR WHEEZING OR SHORTNESS OF BREATH. SEASONALLY FOR ASTHMA     Pulmonology:  Beta Agonists Failed  - 06/28/2021  1:22 PM      Failed - One inhaler should last at least one month. If the patient is requesting refills earlier, contact the patient to check for uncontrolled symptoms.      Passed - Valid encounter within last 12 months    Recent Outpatient Visits          2 weeks ago Primary hypertension   [facility] Birdie Sons, MD   4 months ago [place], Vickki Muff, PA-C   5 months ago [place], Donald E, MD   6 months ago Benign hypertension   [facility] Birdie Sons, MD   1 year ago Need for influenza vaccination   [facility] Birdie Sons, MD

## 2021-07-16 ENCOUNTER — Encounter: Payer: Self-pay | Admitting: Family Medicine

## 2021-07-18 MED ORDER — VITAMIN D (ERGOCALCIFEROL) 1.25 MG (50000 UNIT) PO CAPS: 50000.0000 [IU] | ORAL_CAPSULE | ORAL | 2 refills | Status: DC

## 2021-08-01 DIAGNOSIS — M47816 Spondylosis without myelopathy or radiculopathy, lumbar region: Secondary | ICD-10-CM | POA: Diagnosis not present

## 2021-08-01 DIAGNOSIS — M791 Myalgia, unspecified site: Secondary | ICD-10-CM | POA: Diagnosis not present

## 2021-08-01 DIAGNOSIS — M159 Polyosteoarthritis, unspecified: Secondary | ICD-10-CM | POA: Diagnosis not present

## 2021-09-27 ENCOUNTER — Other Ambulatory Visit: Payer: Self-pay

## 2021-09-27 ENCOUNTER — Ambulatory Visit (INDEPENDENT_AMBULATORY_CARE_PROVIDER_SITE_OTHER): Payer: PPO | Admitting: Physician Assistant

## 2021-09-27 ENCOUNTER — Encounter: Payer: Self-pay | Admitting: Physician Assistant

## 2021-09-27 VITALS — BP 140/83 | HR 68 | Temp 97.8°F | Resp 16 | Wt 131.8 lb

## 2021-09-27 DIAGNOSIS — H6593 Unspecified nonsuppurative otitis media, bilateral: Secondary | ICD-10-CM | POA: Diagnosis not present

## 2021-09-27 NOTE — Progress Notes (Signed)
Established patient visit   Patient: Michelle Parrish   DOB: 08/17/1941   81 y.o. Female  MRN: [id number] Visit Date: 09/27/2021  Today's healthcare provider: Dani Gobble Hodges Treiber, PA-C  Introduced myself to the patient as a Journalist, newspaper and provided education on APPs in clinical practice.     I,Joseline E Rosas,acting as a scribe for [employer], PA-C.,have documented all relevant documentation on the behalf of [place], PA-C,as directed by  [employer], PA-C while in the presence of Aseneth Hack E Kamen Hanken, PA-C.   Chief Complaint  Patient presents with   Ear Pain   Subjective    Otalgia  There is pain in the left ear. This is a new problem. There has been no fever. The patient is experiencing no pain. Pertinent negatives include no coughing, ear discharge, headaches, neck pain, rhinorrhea or sore throat. Treatments tried: Mucinex.  She has an appointment scheduled with ENT in March.  States she is recovering from recent cold  States her symptoms in the ear started approx 6 days  States her ear feels like she "is under water"  Denies pain in general but admits to fullness and hearing seems muffled    Medications: Outpatient Medications Prior to Visit  Medication Sig   albuterol (VENTOLIN HFA) 108 (90 Base) MCG/ACT inhaler INHALE 2 PUFFS INTO THE LUNGS EVERY 6 (SIX) HOURS AS NEEDED FOR WHEEZING OR SHORTNESS OF BREATH. SEASONALLY FOR ASTHMA   amLODipine (NORVASC) 5 MG tablet TAKE 1 TABLET BY MOUTH EVERY DAY   EPIPEN 2-PAK 0.3 MG/0.3ML SOAJ injection INJECT INTO THIGH MUSCLE AS NEEDED FOR ANAPHYLAXIS.   fluticasone-salmeterol (ADVAIR) 100-50 MCG/ACT AEPB Inhale 1 puff into the lungs 2 (two) times daily.   gabapentin (NEURONTIN) 100 MG capsule Take 2 capsules by mouth Nightly.   lisinopril-hydrochlorothiazide (ZESTORETIC) 20-12.5 MG tablet Take 1 tablet by mouth daily.   metoprolol succinate (TOPROL-XL) 100 MG 24 hr tablet Take 1 tablet (100 mg total) by mouth daily. Take with or immediately  following a meal.   montelukast (SINGULAIR) 10 MG tablet TAKE 1 TABLET BY MOUTH EVERY DAY   Multiple Vitamin (MULTIVITAMIN) tablet Take 1 tablet by mouth daily.   Vitamin D, Ergocalciferol, (DRISDOL) 1.25 MG (50000 UNIT) CAPS capsule Take 1 capsule (50,000 Units total) by mouth every 30 (thirty) days.   risedronate (ACTONEL) 35 MG tablet TAKE 1 TAB BY MOUTH EVERY 7 DAYS WITH WATER ON EMPTY STOMACH, DO NOT LIE DOWN FOR 30MIN AFTER (Patient not taking: Reported on 09/27/2021)   No facility-administered medications prior to visit.    Review of Systems  HENT:  Positive for ear pain. Negative for ear discharge, rhinorrhea and sore throat.   Respiratory:  Negative for cough.   Musculoskeletal:  Negative for neck pain.  Neurological:  Negative for headaches.      Objective    BP 140/83 (BP Location: Left Arm, Patient Position: Sitting, Cuff Size: Normal)    Pulse 68    Temp 97.8 F (36.6 C) (Oral)    Resp 16    Wt 131 lb 12.8 oz (59.8 kg)    SpO2 99%    BMI 21.27 kg/m  {Show previous vital signs (optional):23777}  Physical Exam HENT:     Right Ear: No decreased hearing noted. No drainage. A middle ear effusion is present. Tympanic membrane is bulging. Tympanic membrane is not perforated, erythematous or retracted.     Left Ear: No decreased hearing noted. No drainage. A middle ear  effusion is present. Tympanic membrane is erythematous. Tympanic membrane is not perforated or retracted.  Cardiovascular:     Rate and Rhythm: Normal rate and regular rhythm.     Pulses: Normal pulses.     Heart sounds: Normal heart sounds.  Pulmonary:     Effort: Pulmonary effort is normal.     Breath sounds: Normal breath sounds and air entry. No decreased breath sounds, wheezing, rhonchi or rales.  Lymphadenopathy:     Head:     Right side of head: No submental or submandibular adenopathy.     Left side of head: No submental or submandibular adenopathy.     Upper Body:     Right upper body: No  supraclavicular adenopathy.     Left upper body: No supraclavicular adenopathy.     No results found for any visits on 09/27/21.  Assessment & Plan     Problem List Items Addressed This Visit   None Visit Diagnoses     Otitis media with effusion, bilateral    -  Primary  Acute, new problem, stable Patient reports she is recovering from a recent URI and ear symptoms began about a week ago. Exam revealed bilateral otitis media with effusion without sign of infection Recommend she use OTC decongestant, antihistamine and ibuprofen to help with reducing congestion and relieving inflammation Follow up if symptoms persist.        No follow-ups on file.   I, Reniah Cottingham E Claressa Hughley, PA-C, have reviewed all documentation for this visit. The documentation on 09/27/21 for the exam, diagnosis, procedures, and orders are all accurate and complete.   Donnie Gedeon, [place] MPH [place], PA-C  [facility] [phone] ([phone] (fax)  [place]

## 2021-10-03 DIAGNOSIS — M898X9 Other specified disorders of bone, unspecified site: Secondary | ICD-10-CM | POA: Diagnosis not present

## 2021-10-03 DIAGNOSIS — M2041 Other hammer toe(s) (acquired), right foot: Secondary | ICD-10-CM | POA: Diagnosis not present

## 2021-10-03 DIAGNOSIS — M778 Other enthesopathies, not elsewhere classified: Secondary | ICD-10-CM | POA: Diagnosis not present

## 2021-11-01 DIAGNOSIS — H6063 Unspecified chronic otitis externa, bilateral: Secondary | ICD-10-CM | POA: Diagnosis not present

## 2021-11-01 DIAGNOSIS — R040 Epistaxis: Secondary | ICD-10-CM | POA: Diagnosis not present

## 2021-11-01 DIAGNOSIS — H9203 Otalgia, bilateral: Secondary | ICD-10-CM | POA: Diagnosis not present

## 2021-12-03 ENCOUNTER — Encounter: Payer: Self-pay | Admitting: Family Medicine

## 2021-12-03 ENCOUNTER — Ambulatory Visit (INDEPENDENT_AMBULATORY_CARE_PROVIDER_SITE_OTHER): Payer: PPO | Admitting: Family Medicine

## 2021-12-03 VITALS — BP 130/78 | HR 73 | Temp 98.1°F | Resp 12 | Wt 131.0 lb

## 2021-12-03 DIAGNOSIS — R42 Dizziness and giddiness: Secondary | ICD-10-CM | POA: Diagnosis not present

## 2021-12-03 DIAGNOSIS — R350 Frequency of micturition: Secondary | ICD-10-CM | POA: Diagnosis not present

## 2021-12-03 DIAGNOSIS — I10 Essential (primary) hypertension: Secondary | ICD-10-CM | POA: Diagnosis not present

## 2021-12-03 LAB — POCT URINALYSIS DIPSTICK
Bilirubin, UA: NEGATIVE
Blood, UA: NEGATIVE
Glucose, UA: NEGATIVE
Ketones, UA: NEGATIVE
Leukocytes, UA: NEGATIVE
Nitrite, UA: NEGATIVE
Protein, UA: NEGATIVE
Spec Grav, UA: 1.01 (ref 1.010–1.025)
Urobilinogen, UA: 0.2 E.U./dL
pH, UA: 6.5 (ref 5.0–8.0)

## 2021-12-03 MED ORDER — VITAMIN D (ERGOCALCIFEROL) 1.25 MG (50000 UNIT) PO CAPS: 50000.0000 [IU] | ORAL_CAPSULE | ORAL | 2 refills | Status: DC

## 2021-12-03 NOTE — Progress Notes (Signed)
?  ? ? ?I,Roshena L Chambers,acting as a scribe for Lelon Huh, MD.,have documented all relevant documentation on the behalf of Lelon Huh, MD,as directed by  Lelon Huh, MD while in the presence of Lelon Huh, MD.  ? ?Established patient visit ? ? ?Patient: Michelle Parrish   DOB: 10/30/1940   80 y.o. Female  MRN: [id number] ?Visit Date: 12/03/2021 ? ?Today's healthcare provider: Lelon Huh, MD  ? ?Chief Complaint  ?Patient presents with  ? Hypertension  ? Urinary Frequency  ? ?Subjective  ?  ?HPI  ?Hypertension, follow-up ? ?BP Readings from Last 3 Encounters:  ?12/03/21 130/78  ?09/27/21 140/83  ?06/11/21 136/70  ? Wt Readings from Last 3 Encounters:  ?12/03/21 131 lb (59.4 kg)  ?09/27/21 131 lb 12.8 oz (59.8 kg)  ?06/11/21 133 lb (60.3 kg)  ?  ? ?She was last seen for hypertension 6 months ago.  ?BP at that visit was 136/70. Management since that visit includes continue same medication. ? ?She reports good compliance with treatment. ?She is not having side effects.  ?She is following a Regular diet. ?She is exercising. ?She does not smoke. ? ?Use of agents associated with hypertension: none.  ? ?Outside blood pressures are 125-130/ 60-80. ?Symptoms: ?No chest pain No chest pressure  ?No palpitations No syncope  ?No dyspnea No orthopnea  ?No paroxysmal nocturnal dyspnea No lower extremity edema  ? ?Pertinent labs ?Lab Results  ?Component Value Date  ? CHOL 206 (H) 06/13/2021  ? HDL 52 06/13/2021  ? [place] 130 (H) 06/13/2021  ? TRIG 136 06/13/2021  ? CHOLHDL 4.0 06/13/2021  ? Lab Results  ?Component Value Date  ? NA 138 06/13/2021  ? K 4.8 06/13/2021  ? CREATININE 0.71 06/13/2021  ? EGFR 87 06/13/2021  ? GLUCOSE 87 06/13/2021  ? TSH 2.500 12/14/2019  ?  ? ?The 10-year ASCVD risk score (Arnett DK, et al., 2019) is: 28.8% ? ?---------------------------------------------------------------------------------------------------  ? ?Urinary frequency: ?Patient reports having urinary frequency, urgency and  burning. Symptoms started this morning.  ? ?She also states she woke up this morning feeling spinning sensation, especially when turning her head. She does have remote history of vertigo for which she she required vestibular therapy and current sx or similar, although not  as severe as the previous episodes. She has taken two doses of dramamine which has helped.  ?Medications: ?Outpatient Medications Prior to Visit  ?Medication Sig  ? albuterol (VENTOLIN HFA) 108 (90 Base) MCG/ACT inhaler INHALE 2 PUFFS INTO THE LUNGS EVERY 6 (SIX) HOURS AS NEEDED FOR WHEEZING OR SHORTNESS OF BREATH. SEASONALLY FOR ASTHMA  ? amLODipine (NORVASC) 5 MG tablet TAKE 1 TABLET BY MOUTH EVERY DAY  ? EPIPEN 2-PAK 0.3 MG/0.3ML SOAJ injection INJECT INTO THIGH MUSCLE AS NEEDED FOR ANAPHYLAXIS.  ? fluticasone-salmeterol (ADVAIR) 100-50 MCG/ACT AEPB Inhale 1 puff into the lungs 2 (two) times daily.  ? gabapentin (NEURONTIN) 100 MG capsule Take 2 capsules by mouth Nightly.  ? lisinopril-hydrochlorothiazide (ZESTORETIC) 20-12.5 MG tablet Take 1 tablet by mouth daily.  ? metoprolol succinate (TOPROL-XL) 100 MG 24 hr tablet Take 1 tablet (100 mg total) by mouth daily. Take with or immediately following a meal.  ? montelukast (SINGULAIR) 10 MG tablet TAKE 1 TABLET BY MOUTH EVERY DAY  ? Multiple Vitamin (MULTIVITAMIN) tablet Take 1 tablet by mouth daily.  ? risedronate (ACTONEL) 35 MG tablet TAKE 1 TAB BY MOUTH EVERY 7 DAYS WITH WATER ON EMPTY STOMACH, DO NOT LIE DOWN FOR 30MIN AFTER  ? Vitamin  D, Ergocalciferol, (DRISDOL) 1.25 MG (50000 UNIT) CAPS capsule Take 1 capsule (50,000 Units total) by mouth every 30 (thirty) days.  ? ?No facility-administered medications prior to visit.  ? ? ?Review of Systems  ?Constitutional:  Negative for appetite change, chills, fatigue and fever.  ?Respiratory:  Negative for chest tightness and shortness of breath.   ?Cardiovascular:  Negative for chest pain and palpitations.  ?Gastrointestinal:  Negative for abdominal  pain, nausea and vomiting.  ?Genitourinary:  Positive for dysuria, frequency and urgency.  ?Neurological:  Negative for dizziness and weakness.  ? ? ?  Objective  ?  ?BP 130/78 (BP Location: Left Arm, Patient Position: Sitting, Cuff Size: Normal)   Pulse 73   Temp 98.1 ?F (36.7 ?C) (Oral)   Resp 12   Wt 131 lb (59.4 kg)   SpO2 100% Comment: room air  BMI 21.14 kg/m?  ? ? ?Physical Exam  ? ?General appearance: Well developed, well nourished female, cooperative and in no acute distress ?Head: Normocephalic, without obvious abnormality, atraumatic ?Respiratory: Respirations even and unlabored, normal respiratory rate ?Extremities: All extremities are intact.  ?Skin: Skin color, texture, turgor normal. No rashes seen  ?Psych: Appropriate mood and affect. ?Neurologic: Mental status: Alert, oriented to person, place, and time, thought content appropriate.  ? ?Results for orders placed or performed in visit on 12/03/21  ?POCT Urinalysis Dipstick  ?Result Value Ref Range  ? Color, UA yellow   ? Clarity, UA clear   ? Glucose, UA Negative Negative  ? Bilirubin, UA negative   ? Ketones, UA negative   ? Spec Grav, UA 1.010 1.010 - 1.025  ? Blood, UA negative   ? pH, UA 6.5 5.0 - 8.0  ? Protein, UA Negative Negative  ? Urobilinogen, UA 0.2 0.2 or 1.0 E.U./dL  ? Nitrite, UA negative   ? Leukocytes, UA Negative Negative  ? Appearance    ? Odor    ? ? Assessment & Plan  ?  ? ?1. Primary hypertension ?Well controlled.  Continue current medications.  Follow up in 6 months. Annual labs at follow up.  ? ?2. Urinary frequency ?Normal u/a. Call if symptoms change or if not rapidly improving.  ?  ?3. Vertigo ?Mild at this point. Encouraged to increase water intake. May continue OTC Dramamine. Call if symptoms change or if not rapidly improving.  ?  ? ?Refill ?- Vitamin D, Ergocalciferol, (DRISDOL) 1.25 MG (50000 UNIT) CAPS capsule; Take 1 capsule (50,000 Units total) by mouth every 30 (thirty) days.  Dispense: 6 capsule; Refill: 2   ?   ? ?The entirety of the information documented in the History of Present Illness, Review of Systems and Physical Exam were personally obtained by me. Portions of this information were initially documented by the CMA and reviewed by me for thoroughness and accuracy.   ? ? ?Lelon Huh, MD  ?[facility] ?[phone] (phone) ?[phone] (fax) ? ? [facility]  ?

## 2021-12-03 NOTE — Patient Instructions (Signed)
.   Please review the attached list of medications and notify my office if there are any errors.   . Please bring all of your medications to every appointment so we can make sure that our medication list is the same as yours.   

## 2021-12-11 DIAGNOSIS — Z8739 Personal history of other diseases of the musculoskeletal system and connective tissue: Secondary | ICD-10-CM | POA: Diagnosis not present

## 2021-12-11 DIAGNOSIS — R42 Dizziness and giddiness: Secondary | ICD-10-CM | POA: Diagnosis not present

## 2021-12-11 DIAGNOSIS — E236 Other disorders of pituitary gland: Secondary | ICD-10-CM | POA: Diagnosis not present

## 2021-12-11 DIAGNOSIS — G43411 Hemiplegic migraine, intractable, with status migrainosus: Secondary | ICD-10-CM | POA: Diagnosis not present

## 2022-01-07 ENCOUNTER — Ambulatory Visit: Payer: Self-pay

## 2022-01-08 ENCOUNTER — Encounter: Payer: Self-pay | Admitting: Physician Assistant

## 2022-01-08 ENCOUNTER — Ambulatory Visit (INDEPENDENT_AMBULATORY_CARE_PROVIDER_SITE_OTHER): Payer: PPO | Admitting: Physician Assistant

## 2022-01-08 VITALS — BP 160/90 | HR 70 | Ht 65.0 in | Wt 131.1 lb

## 2022-01-08 DIAGNOSIS — L03011 Cellulitis of right finger: Secondary | ICD-10-CM

## 2022-01-08 DIAGNOSIS — L03019 Cellulitis of unspecified finger: Secondary | ICD-10-CM | POA: Diagnosis not present

## 2022-01-08 NOTE — Progress Notes (Signed)
Established patient visit   Patient: Michelle Parrish   DOB: February 03, 1941   81 y.o. Female  MRN: [id number] Visit Date: 01/08/2022  Today's healthcare provider: Mardene Speak, PA-C  CC: "infected finger x 5 days."     HPI  Patient believes she has a infected finger. States it is her middle finger on her right hand and states she has been having a issue for 5 days now states weekend was the worse. States it was so swollen. Patient reports she had a amoxicillin 500 that she previously had for UTI but turned out she needed something else so she never took it. States rx helped with the redness. Swelling has gotten a little better to where she can bend it now but couldn't bend it Saturday. Also reports some discoloring around the nail   Medications: Outpatient Medications Prior to Visit  Medication Sig   albuterol (VENTOLIN HFA) 108 (90 Base) MCG/ACT inhaler INHALE 2 PUFFS INTO THE LUNGS EVERY 6 (SIX) HOURS AS NEEDED FOR WHEEZING OR SHORTNESS OF BREATH. SEASONALLY FOR ASTHMA   amLODipine (NORVASC) 5 MG tablet TAKE 1 TABLET BY MOUTH EVERY DAY   EPIPEN 2-PAK 0.3 MG/0.3ML SOAJ injection INJECT INTO THIGH MUSCLE AS NEEDED FOR ANAPHYLAXIS.   fluticasone-salmeterol (ADVAIR) 100-50 MCG/ACT AEPB Inhale 1 puff into the lungs 2 (two) times daily.   gabapentin (NEURONTIN) 100 MG capsule Take 2 capsules by mouth Nightly.   lisinopril-hydrochlorothiazide (ZESTORETIC) 20-12.5 MG tablet Take 1 tablet by mouth daily.   metoprolol succinate (TOPROL-XL) 100 MG 24 hr tablet Take 1 tablet (100 mg total) by mouth daily. Take with or immediately following a meal.   montelukast (SINGULAIR) 10 MG tablet TAKE 1 TABLET BY MOUTH EVERY DAY   Multiple Vitamin (MULTIVITAMIN) tablet Take 1 tablet by mouth daily.   risedronate (ACTONEL) 35 MG tablet TAKE 1 TAB BY MOUTH EVERY 7 DAYS WITH WATER ON EMPTY STOMACH, DO NOT LIE DOWN FOR 30MIN AFTER   Vitamin D, Ergocalciferol, (DRISDOL) 1.25 MG (50000 UNIT) CAPS capsule Take 1  capsule (50,000 Units total) by mouth every 30 (thirty) days.   No facility-administered medications prior to visit.    Review of Systems  Respiratory: Negative.    Cardiovascular: Negative.   See HPI    Objective    BP (!) 160/90   Pulse 70   Ht '5\' 5"'$  (1.651 m)   Wt 131 lb 1.6 oz (59.5 kg)   SpO2 100%   BMI 21.82 kg/m    Physical Exam Vitals reviewed.  Constitutional:      General: She is in acute distress.     Appearance: Normal appearance. She is normal weight.  HENT:     Head: Normocephalic and atraumatic.  Cardiovascular:     Rate and Rhythm: Normal rate and regular rhythm.     Pulses: Normal pulses.  Pulmonary:     Effort: Pulmonary effort is normal.     Breath sounds: Normal breath sounds.  Skin:    Findings: Lesion present.     Comments: Swelling, erythema, and a purulent collection are present in this patient   Neurological:     Mental Status: She is alert and oriented to person, place, and time.  Psychiatric:        Behavior: Behavior normal.        Thought Content: Thought content normal.        Judgment: Judgment normal.     No results found for any visits on 01/08/22.  Assessment &  Plan     1. Paronychia of finger of right hand with abscess Request an urgent apt with [facility]. No provider was available.  Advised patient to see [facility] for a proper drainage  Patient has been taking two course of antibiotics without substantial relief.  FU as needed  The patient was advised to call back or seek an in-person evaluation if the symptoms worsen or if the condition fails to improve as anticipated.  I discussed the assessment and treatment plan with the patient. The patient was provided an opportunity to ask questions and all were answered. The patient agreed with the plan and demonstrated an understanding of the instructions.  The entirety of the information documented in the History of Present Illness, Review of Systems and Physical Exam  were personally obtained by me. Portions of this information were initially documented by the CMA and reviewed by me for thoroughness and accuracy.  Portions of this note were created using dictation software and may contain typographical errors.    Mardene Speak, PA-C  [facility] [phone] (phone) [phone] (fax)  [place]

## 2022-01-14 ENCOUNTER — Other Ambulatory Visit: Payer: Self-pay | Admitting: Family Medicine

## 2022-01-14 DIAGNOSIS — I10 Essential (primary) hypertension: Secondary | ICD-10-CM

## 2022-01-15 DIAGNOSIS — L03019 Cellulitis of unspecified finger: Secondary | ICD-10-CM | POA: Diagnosis not present

## 2022-01-30 DIAGNOSIS — M791 Myalgia, unspecified site: Secondary | ICD-10-CM | POA: Diagnosis not present

## 2022-01-30 DIAGNOSIS — M159 Polyosteoarthritis, unspecified: Secondary | ICD-10-CM | POA: Diagnosis not present

## 2022-01-30 DIAGNOSIS — M47816 Spondylosis without myelopathy or radiculopathy, lumbar region: Secondary | ICD-10-CM | POA: Diagnosis not present

## 2022-04-18 ENCOUNTER — Other Ambulatory Visit: Payer: Self-pay | Admitting: Family Medicine

## 2022-04-18 DIAGNOSIS — Z1231 Encounter for screening mammogram for malignant neoplasm of breast: Secondary | ICD-10-CM

## 2022-04-23 ENCOUNTER — Other Ambulatory Visit: Payer: Self-pay | Admitting: Family Medicine

## 2022-04-23 DIAGNOSIS — Z85828 Personal history of other malignant neoplasm of skin: Secondary | ICD-10-CM | POA: Diagnosis not present

## 2022-04-23 DIAGNOSIS — D2271 Melanocytic nevi of right lower limb, including hip: Secondary | ICD-10-CM | POA: Diagnosis not present

## 2022-04-23 DIAGNOSIS — L570 Actinic keratosis: Secondary | ICD-10-CM | POA: Diagnosis not present

## 2022-04-23 DIAGNOSIS — J45909 Unspecified asthma, uncomplicated: Secondary | ICD-10-CM

## 2022-04-23 DIAGNOSIS — X32XXXA Exposure to sunlight, initial encounter: Secondary | ICD-10-CM | POA: Diagnosis not present

## 2022-04-23 DIAGNOSIS — D2262 Melanocytic nevi of left upper limb, including shoulder: Secondary | ICD-10-CM | POA: Diagnosis not present

## 2022-04-23 DIAGNOSIS — D2261 Melanocytic nevi of right upper limb, including shoulder: Secondary | ICD-10-CM | POA: Diagnosis not present

## 2022-05-02 DIAGNOSIS — R040 Epistaxis: Secondary | ICD-10-CM | POA: Diagnosis not present

## 2022-05-02 DIAGNOSIS — H9209 Otalgia, unspecified ear: Secondary | ICD-10-CM | POA: Diagnosis not present

## 2022-05-02 DIAGNOSIS — H6063 Unspecified chronic otitis externa, bilateral: Secondary | ICD-10-CM | POA: Diagnosis not present

## 2022-05-02 DIAGNOSIS — H9319 Tinnitus, unspecified ear: Secondary | ICD-10-CM | POA: Diagnosis not present

## 2022-05-02 DIAGNOSIS — R42 Dizziness and giddiness: Secondary | ICD-10-CM | POA: Diagnosis not present

## 2022-05-06 DIAGNOSIS — H43813 Vitreous degeneration, bilateral: Secondary | ICD-10-CM | POA: Diagnosis not present

## 2022-05-24 ENCOUNTER — Ambulatory Visit: Payer: Self-pay | Admitting: *Deleted

## 2022-05-24 ENCOUNTER — Ambulatory Visit (INDEPENDENT_AMBULATORY_CARE_PROVIDER_SITE_OTHER): Payer: PPO | Admitting: Family Medicine

## 2022-05-24 ENCOUNTER — Encounter: Payer: Self-pay | Admitting: Family Medicine

## 2022-05-24 VITALS — BP 128/75 | HR 78 | Temp 99.0°F | Resp 16

## 2022-05-24 DIAGNOSIS — I10 Essential (primary) hypertension: Secondary | ICD-10-CM | POA: Diagnosis not present

## 2022-05-24 DIAGNOSIS — E559 Vitamin D deficiency, unspecified: Secondary | ICD-10-CM | POA: Diagnosis not present

## 2022-05-24 DIAGNOSIS — R051 Acute cough: Secondary | ICD-10-CM

## 2022-05-24 DIAGNOSIS — E785 Hyperlipidemia, unspecified: Secondary | ICD-10-CM

## 2022-05-24 DIAGNOSIS — U071 COVID-19: Secondary | ICD-10-CM | POA: Diagnosis not present

## 2022-05-24 LAB — POC COVID19 BINAXNOW: SARS Coronavirus 2 Ag: POSITIVE — AB

## 2022-05-24 MED ORDER — NIRMATRELVIR/RITONAVIR (PAXLOVID)TABLET: 3.0000 | ORAL_TABLET | Freq: Two times a day (BID) | ORAL | 0 refills | Status: AC

## 2022-05-24 NOTE — Progress Notes (Signed)
I,Roshena L Chambers,acting as a scribe for Lelon Huh, MD.,have documented all relevant documentation on the behalf of Lelon Huh, MD,as directed by  Lelon Huh, MD while in the presence of Lelon Huh, MD.   Established patient visit   Patient: Michelle Parrish   DOB: [birth year]   81 y.o. Female  MRN: [id number] Visit Date: 05/24/2022  Today's healthcare provider: Lelon Huh, MD   Chief Complaint  Patient presents with   Cough   Subjective    Cough This is a new problem. The current episode started yesterday. Associated symptoms include chills, a fever (up to 100), headaches, myalgias, rhinorrhea, a sore throat and shortness of breath. Pertinent negatives include no chest pain. Treatments tried: Tylenol and routine Asthma inhalers.    Patients husband recently tested positive for COVID yesterday.   Medications: Outpatient Medications Prior to Visit  Medication Sig   ADVAIR DISKUS 100-50 MCG/ACT AEPB INHALE 1 PUFF INTO THE LUNGS TWICE A DAY   albuterol (VENTOLIN HFA) 108 (90 Base) MCG/ACT inhaler INHALE 2 PUFFS INTO THE LUNGS EVERY 6 (SIX) HOURS AS NEEDED FOR WHEEZING OR SHORTNESS OF BREATH. SEASONALLY FOR ASTHMA   amLODipine (NORVASC) 5 MG tablet TAKE 1 TABLET BY MOUTH EVERY DAY   EPIPEN 2-PAK 0.3 MG/0.3ML SOAJ injection INJECT INTO THIGH MUSCLE AS NEEDED FOR ANAPHYLAXIS.   gabapentin (NEURONTIN) 100 MG capsule Take 2 capsules by mouth Nightly.   lisinopril-hydrochlorothiazide (ZESTORETIC) 20-12.5 MG tablet TAKE 1 TABLET BY MOUTH EVERY DAY   metoprolol succinate (TOPROL-XL) 100 MG 24 hr tablet TAKE 1 TABLET BY MOUTH DAILY. TAKE WITH OR IMMEDIATELY FOLLOWING A MEAL.   montelukast (SINGULAIR) 10 MG tablet TAKE 1 TABLET BY MOUTH EVERY DAY   Multiple Vitamin (MULTIVITAMIN) tablet Take 1 tablet by mouth daily.   risedronate (ACTONEL) 35 MG tablet TAKE 1 TAB BY MOUTH EVERY 7 DAYS WITH WATER ON EMPTY STOMACH, DO NOT LIE DOWN FOR 30MIN AFTER   Vitamin D, Ergocalciferol,  (DRISDOL) 1.25 MG (50000 UNIT) CAPS capsule Take 1 capsule (50,000 Units total) by mouth every 30 (thirty) days.   No facility-administered medications prior to visit.    Review of Systems  Constitutional:  Positive for chills and fever (up to 100). Negative for appetite change and fatigue.  HENT:  Positive for rhinorrhea and sore throat.   Respiratory:  Positive for cough (dry cough) and shortness of breath. Negative for chest tightness.   Cardiovascular:  Negative for chest pain and palpitations.  Gastrointestinal:  Negative for abdominal pain, nausea and vomiting.  Musculoskeletal:  Positive for myalgias.  Neurological:  Positive for headaches. Negative for dizziness and weakness.       Objective    BP 128/75 (BP Location: Left Arm, Patient Position: Sitting, Cuff Size: Normal)   Pulse 78   Temp 99 F (37.2 C) (Oral)   Resp 16   SpO2 99% Comment: room air   Physical Exam   General: Appearance:    Well developed, well nourished female in no acute distress  Eyes:    PERRL, conjunctiva/corneas clear, EOM's intact       Lungs:     Clear to auscultation bilaterally, respirations unlabored  Heart:    Normal heart rate. Normal rhythm.  2/6 systolic murmur at right upper sternal border   MS:   All extremities are intact.    Neurologic:   Awake, alert, oriented x 3. No apparent focal neurological defect.         Results for orders placed  or performed in visit on 05/24/22  POC COVID-19  Result Value Ref Range   SARS Coronavirus 2 Ag Positive (A) Negative    Assessment & Plan     1. Acute cough  2. COVID-19 - nirmatrelvir/ritonavir EUA (PAXLOVID) 20 x 150 MG & 10 x '100MG'$  TABS; Take 3 tablets by mouth 2 (two) times daily for 5 days. (Take nirmatrelvir 150 mg two tablets twice daily for 5 days and ritonavir 100 mg one tablet twice daily for 5 days) Patient GFR is 87  Dispense: 30 tablet; Refill: 0 Call if symptoms change or if not rapidly improving.      She would like to  return to get labs drawn when she is feeling better. She is doing well on her current maintenance medications. Given lab order for the following today.   3. Avitaminosis D  - VITAMIN D 25 Hydroxy (Vit-D Deficiency, Fractures)  4. Hyperlipidemia, unspecified hyperlipidemia type  - CBC - Comprehensive metabolic panel - Lipid panel  5. Primary hypertension  - Magnesium       The entirety of the information documented in the History of Present Illness, Review of Systems and Physical Exam were personally obtained by me. Portions of this information were initially documented by the CMA and reviewed by me for thoroughness and accuracy.     Lelon Huh, MD  [facility] [phone] (phone) [phone] (fax)  [place]

## 2022-05-24 NOTE — Telephone Encounter (Signed)
  Chief Complaint: covid symptoms Symptoms: cough, chills, aches, ST Frequency: since yesterday Pertinent Negatives: Patient denies loss of smell or taste Disposition: '[]'$ ED /'[]'$ Urgent Care (no appt availability in office) / '[x]'$ Appointment(In office/virtual)/ '[]'$  [place] [facility]/ '[]'$ Home Care/ '[]'$ Refused Recommended Disposition /'[]'$ [place] [facility]/ '[]'$  Follow-up with PCP Additional Notes: Appt today with Dr. Caryn Section. Advised to wear medical mask.  Reason for Disposition  [1] HIGH RISK patient (e.g., weak immune system, age > 40 years, obesity with BMI 30 or higher, pregnant, chronic lung disease or other chronic medical condition) AND [2] COVID symptoms (e.g., cough, fever)  (Exceptions: Already seen by PCP and no new or worsening symptoms.)  Answer Assessment - Initial Assessment Questions 1. COVID-19 DIAGNOSIS: "How do you know that you have COVID?" (e.g., positive lab test or self-test, diagnosed by doctor or NP/PA, symptoms after exposure).     Out of home test but husband positive 2. COVID-19 EXPOSURE: "Was there any known exposure to COVID before the symptoms began?" CDC Definition of close contact: within 6 feet (2 meters) for a total of 15 minutes or more over a 24-hour period.      Lives with husband so was exposed 3. ONSET: "When did the COVID-19 symptoms start?"      Yesterday, aches, ST, congestion 4. WORST SYMPTOM: "What is your worst symptom?" (e.g., cough, fever, shortness of breath, muscle aches)     Aches and fever 5. COUGH: "Do you have a cough?" If Yes, ask: "How bad is the cough?"       Coughing, non productive 6. FEVER: "Do you have a fever?" If Yes, ask: "What is your temperature, how was it measured, and when did it start?"     Low grade fever 7. RESPIRATORY STATUS: "Describe your breathing?" (e.g., normal; shortness of breath, wheezing, unable to speak)      no 8. BETTER-SAME-WORSE: "Are you getting better, staying the same or getting worse compared to  yesterday?"  If getting worse, ask, "In what way?"     same 9. OTHER SYMPTOMS: "Do you have any other symptoms?"  (e.g., chills, fatigue, headache, loss of smell or taste, muscle pain, sore throat)     Chills, headache, fatigue, muscle pain ST 10. HIGH RISK DISEASE: "Do you have any chronic medical problems?" (e.g., asthma, heart or lung disease, weak immune system, obesity, etc.)       Asthma and irregular heartbeat 11. VACCINE: "Have you had the COVID-19 vaccine?" If Yes, ask: "Which one, how many shots, when did you get it?"       Yes, not the newest one 12. PREGNANCY: "Is there any chance you are pregnant?" "When was your last menstrual period?"       no 13. O2 SATURATION MONITOR:  "Do you use an oxygen saturation monitor (pulse oximeter) at home?" If Yes, ask "What is your reading (oxygen level) today?" "What is your usual oxygen saturation reading?" (e.g., 95%)       no  Protocols used: Coronavirus (COVID-19) Diagnosed or Suspected-A-AH

## 2022-05-31 ENCOUNTER — Encounter: Payer: Self-pay | Admitting: Family Medicine

## 2022-06-05 ENCOUNTER — Ambulatory Visit: Payer: PPO | Admitting: Family Medicine

## 2022-06-10 DIAGNOSIS — E559 Vitamin D deficiency, unspecified: Secondary | ICD-10-CM | POA: Diagnosis not present

## 2022-06-10 DIAGNOSIS — E785 Hyperlipidemia, unspecified: Secondary | ICD-10-CM | POA: Diagnosis not present

## 2022-06-10 DIAGNOSIS — I10 Essential (primary) hypertension: Secondary | ICD-10-CM | POA: Diagnosis not present

## 2022-06-11 LAB — COMPREHENSIVE METABOLIC PANEL
ALT: 9 IU/L (ref 0–32)
AST: 22 IU/L (ref 0–40)
Albumin/Globulin Ratio: 1.9 (ref 1.2–2.2)
Albumin: 4.5 g/dL (ref 3.8–4.8)
Alkaline Phosphatase: 66 IU/L (ref 44–121)
BUN/Creatinine Ratio: 9 — ABNORMAL LOW (ref 12–28)
BUN: 6 mg/dL — ABNORMAL LOW (ref 8–27)
Bilirubin Total: 0.7 mg/dL (ref 0.0–1.2)
CO2: 22 mmol/L (ref 20–29)
Calcium: 9.4 mg/dL (ref 8.7–10.3)
Chloride: 97 mmol/L (ref 96–106)
Creatinine, Ser: 0.66 mg/dL (ref 0.57–1.00)
Globulin, Total: 2.4 g/dL (ref 1.5–4.5)
Glucose: 82 mg/dL (ref 70–99)
Potassium: 4.5 mmol/L (ref 3.5–5.2)
Sodium: 136 mmol/L (ref 134–144)
Total Protein: 6.9 g/dL (ref 6.0–8.5)
eGFR: 89 mL/min/{1.73_m2} (ref >59)

## 2022-06-11 LAB — VITAMIN D 25 HYDROXY (VIT D DEFICIENCY, FRACTURES): Vit D, 25-Hydroxy: 55.4 ng/mL (ref 30.0–100.0)

## 2022-06-11 LAB — LIPID PANEL
Chol/HDL Ratio: 3.8 ratio (ref 0.0–4.4)
Cholesterol, Total: 195 mg/dL (ref 100–199)
HDL: 52 mg/dL (ref >39)
LDL Chol Calc (NIH): 123 mg/dL — ABNORMAL HIGH (ref 0–99)
Triglycerides: 109 mg/dL (ref 0–149)
VLDL Cholesterol Cal: 20 mg/dL (ref 5–40)

## 2022-06-11 LAB — CBC
Hematocrit: 43 % (ref 34.0–46.6)
Hemoglobin: 14.4 g/dL (ref 11.1–15.9)
MCH: 28.7 pg (ref 26.6–33.0)
MCHC: 33.5 g/dL (ref 31.5–35.7)
MCV: 86 fL (ref 79–97)
Platelets: 397 10*3/uL (ref 150–450)
RBC: 5.02 x10E6/uL (ref 3.77–5.28)
RDW: 12 % (ref 11.7–15.4)
WBC: 7.4 10*3/uL (ref 3.4–10.8)

## 2022-06-11 LAB — MAGNESIUM: Magnesium: 2.4 mg/dL — ABNORMAL HIGH (ref 1.6–2.3)

## 2022-06-12 ENCOUNTER — Ambulatory Visit (INDEPENDENT_AMBULATORY_CARE_PROVIDER_SITE_OTHER): Payer: PPO

## 2022-06-12 DIAGNOSIS — Z23 Encounter for immunization: Secondary | ICD-10-CM

## 2022-06-21 ENCOUNTER — Other Ambulatory Visit: Payer: Self-pay | Admitting: Family Medicine

## 2022-06-21 DIAGNOSIS — I10 Essential (primary) hypertension: Secondary | ICD-10-CM

## 2022-06-21 DIAGNOSIS — J301 Allergic rhinitis due to pollen: Secondary | ICD-10-CM

## 2022-06-21 DIAGNOSIS — J45909 Unspecified asthma, uncomplicated: Secondary | ICD-10-CM

## 2022-06-21 NOTE — Telephone Encounter (Signed)
Requested Prescriptions  Pending Prescriptions Disp Refills   montelukast (SINGULAIR) 10 MG tablet [Pharmacy Med Name: MONTELUKAST SOD 10 MG TABLET] 90 tablet 0    Sig: TAKE 1 TABLET BY MOUTH EVERY DAY     Pulmonology:  Leukotriene Inhibitors Passed - 06/21/2022 11:45 AM      Passed - Valid encounter within last 12 months    Recent Outpatient Visits           4 weeks ago Acute cough   [facility] Birdie Sons, MD   5 months ago Paronychia of finger of right hand   [facility] [place], [place], PA-C   6 months ago Primary hypertension   [facility] Birdie Sons, MD   8 months ago Otitis media with effusion, bilateral   [employer], Dani Gobble, PA-C   1 year ago Primary hypertension   [facility], Kirstie Peri, MD               amLODipine (NORVASC) 5 MG tablet [Pharmacy Med Name: AMLODIPINE BESYLATE 5 MG TAB] 90 tablet 0    Sig: TAKE 1 TABLET BY MOUTH EVERY DAY     Cardiovascular: Calcium Channel Blockers 2 Passed - 06/21/2022 11:45 AM      Passed - Last BP in normal range    BP Readings from Last 1 Encounters:  05/24/22 128/75         Passed - Last Heart Rate in normal range    Pulse Readings from Last 1 Encounters:  05/24/22 78         Passed - Valid encounter within last 6 months    Recent Outpatient Visits           4 weeks ago Acute cough   [place], MD   5 months ago Paronychia of finger of right hand   [employer], [place], PA-C   6 months ago Primary hypertension   [facility] Birdie Sons, MD   8 months ago Otitis media with effusion, bilateral   [employer], Dani Gobble, PA-C   1 year ago Primary hypertension   [place], Kirstie Peri, MD

## 2022-07-03 ENCOUNTER — Ambulatory Visit
Admission: RE | Admit: 2022-07-03 | Discharge: 2022-07-03 | Disposition: A | Discharge status: Home / Self Care | Payer: PPO | Source: Ambulatory Visit | Attending: Family Medicine | Admitting: Family Medicine

## 2022-07-03 DIAGNOSIS — Z1231 Encounter for screening mammogram for malignant neoplasm of breast: Secondary | ICD-10-CM | POA: Insufficient documentation

## 2022-07-10 DIAGNOSIS — R002 Palpitations: Secondary | ICD-10-CM | POA: Diagnosis not present

## 2022-07-10 DIAGNOSIS — I739 Peripheral vascular disease, unspecified: Secondary | ICD-10-CM | POA: Diagnosis not present

## 2022-07-10 DIAGNOSIS — I10 Essential (primary) hypertension: Secondary | ICD-10-CM | POA: Diagnosis not present

## 2022-07-19 ENCOUNTER — Other Ambulatory Visit: Payer: Self-pay | Admitting: Family Medicine

## 2022-07-19 DIAGNOSIS — J45909 Unspecified asthma, uncomplicated: Secondary | ICD-10-CM

## 2022-07-22 NOTE — Telephone Encounter (Signed)
Requested medications are due for refill today.  See note   Requested medications are on the active medications list.  yes  Last refill. 04/23/2022 #60 12 rf  Future visit scheduled.   no  Notes to clinic.  Pharmacy comment: Product Backordered/Unavailable.     Requested Prescriptions  Pending Prescriptions Disp Refills   ADVAIR DISKUS 100-50 MCG/ACT AEPB [Pharmacy Med Name: ADVAIR 100-50 DISKUS] 60 each 12    Sig: INHALE 1 PUFF INTO THE LUNGS TWICE A DAY     Pulmonology:  Combination Products Passed - 07/19/2022  6:55 PM      Passed - Valid encounter within last 12 months    Recent Outpatient Visits           1 month ago Acute cough   [place], MD   6 months ago Paronychia of finger of right hand   [place], [place], PA-C   7 months ago Primary hypertension   [facility] Birdie Sons, MD   9 months ago Otitis media with effusion, bilateral   [employer], Dani Gobble, PA-C   1 year ago Primary hypertension   [place], Kirstie Peri, MD

## 2022-08-06 ENCOUNTER — Encounter: Payer: Self-pay | Admitting: Family Medicine

## 2022-08-26 ENCOUNTER — Encounter: Payer: Self-pay | Admitting: Physician Assistant

## 2022-08-27 ENCOUNTER — Ambulatory Visit: Payer: PPO | Admitting: Family Medicine

## 2022-08-27 NOTE — Progress Notes (Unsigned)
I,Sulibeya S Dimas,acting as a scribe for Lelon Huh, MD.,have documented all relevant documentation on the behalf of Lelon Huh, MD,as directed by  Lelon Huh, MD while in the presence of Lelon Huh, MD.     Established patient visit   Patient: Michelle Parrish   DOB: 02-14-1941   82 y.o. Female  MRN: [id number] Visit Date: 08/28/2022  Today's healthcare provider: Lelon Huh, MD   Chief Complaint  Patient presents with   Urinary Tract Infection   Subjective    HPI  Urinary symptoms  She reports new onset dysuria and urinary frequency. The current episode started  4 days ago and is staying constant. Patient states symptoms are mild in intensity, occurring constantly. She  has not been recently treated for similar symptoms. States she did take OTC urine strip  Also report URI symptoms since Christmas day which is persisting.  ---------------------------------------------------------------------------------------   Medications: Outpatient Medications Prior to Visit  Medication Sig   albuterol (VENTOLIN HFA) 108 (90 Base) MCG/ACT inhaler INHALE 2 PUFFS INTO THE LUNGS EVERY 6 (SIX) HOURS AS NEEDED FOR WHEEZING OR SHORTNESS OF BREATH. SEASONALLY FOR ASTHMA   amLODipine (NORVASC) 5 MG tablet TAKE 1 TABLET BY MOUTH EVERY DAY   EPIPEN 2-PAK 0.3 MG/0.3ML SOAJ injection INJECT INTO THIGH MUSCLE AS NEEDED FOR ANAPHYLAXIS.   fluticasone-salmeterol (WIXELA INHUB) 100-50 MCG/ACT AEPB Inhale 1 puff into the lungs 2 (two) times daily.   gabapentin (NEURONTIN) 100 MG capsule Take 2 capsules by mouth Nightly.   lisinopril-hydrochlorothiazide (ZESTORETIC) 20-12.5 MG tablet TAKE 1 TABLET BY MOUTH EVERY DAY   metoprolol succinate (TOPROL-XL) 100 MG 24 hr tablet TAKE 1 TABLET BY MOUTH DAILY. TAKE WITH OR IMMEDIATELY FOLLOWING A MEAL.   montelukast (SINGULAIR) 10 MG tablet TAKE 1 TABLET BY MOUTH EVERY DAY   Multiple Vitamin (MULTIVITAMIN) tablet Take 1 tablet by mouth daily.    risedronate (ACTONEL) 35 MG tablet TAKE 1 TAB BY MOUTH EVERY 7 DAYS WITH WATER ON EMPTY STOMACH, DO NOT LIE DOWN FOR 30MIN AFTER   Vitamin D, Ergocalciferol, (DRISDOL) 1.25 MG (50000 UNIT) CAPS capsule Take 1 capsule (50,000 Units total) by mouth every 30 (thirty) days.   No facility-administered medications prior to visit.    Review of Systems  Constitutional:  Positive for chills. Negative for fever.  Cardiovascular:  Negative for chest pain.  Gastrointestinal:  Negative for abdominal pain, constipation, diarrhea, nausea and vomiting.  Genitourinary:  Positive for dysuria and frequency. Negative for hematuria and vaginal discharge.       Objective    BP (!) 140/69 (BP Location: Left Arm, Patient Position: Sitting, Cuff Size: Normal)   Pulse 73   Temp 98.3 F (36.8 C) (Oral)   Resp 16   Wt 133 lb 6.4 oz (60.5 kg)   SpO2 100%   BMI 22.20 kg/m  BP Readings from Last 3 Encounters:  08/28/22 (!) 140/69  05/24/22 128/75  01/08/22 (!) 160/90   Wt Readings from Last 3 Encounters:  08/28/22 133 lb 6.4 oz (60.5 kg)  01/08/22 131 lb 1.6 oz (59.5 kg)  12/03/21 131 lb (59.4 kg)   Physical Exam   General Appearance:    Well developed, well nourished female, alert, cooperative, in no acute distress  HENT:   neck without nodes, frontal sinus tender, and nasal mucosa congested  Eyes:    PERRL, conjunctiva/corneas clear, EOM's intact       Lungs:     Clear to auscultation bilaterally, respirations unlabored  Heart:  Normal heart rate. Normal rhythm.  2/6   Neurologic:   Awake, alert, oriented x 3. No apparent focal neurological           defect.        Results for orders placed or performed in visit on 08/28/22  POCT urinalysis dipstick  Result Value Ref Range   Color, UA yellow    Clarity, UA clear    Glucose, UA Negative Negative   Bilirubin, UA Negative    Ketones, UA Negative    Spec Grav, UA <=1.005 (A) 1.010 - 1.025   Blood, UA Negative    pH, UA 7.5 5.0 - 8.0    Protein, UA Negative Negative   Urobilinogen, UA 0.2 0.2 or 1.0 E.U./dL   Nitrite, UA Negative    Leukocytes, UA Negative Negative    Assessment & Plan     1. Dysuria  - Urine Culture  2. Sinusitis, unspecified chronicity, unspecified location  - cefdinir (OMNICEF) 300 MG capsule; Take 2 capsules (600 mg total) by mouth daily for 7 days.  Dispense: 14 capsule; Refill: 0      The entirety of the information documented in the History of Present Illness, Review of Systems and Physical Exam were personally obtained by me. Portions of this information were initially documented by the CMA and reviewed by me for thoroughness and accuracy.     Lelon Huh, MD  [facility] [phone] (phone) [phone] (fax)  [place]

## 2022-08-28 ENCOUNTER — Encounter: Payer: Self-pay | Admitting: Family Medicine

## 2022-08-28 ENCOUNTER — Ambulatory Visit (INDEPENDENT_AMBULATORY_CARE_PROVIDER_SITE_OTHER): Payer: PPO | Admitting: Family Medicine

## 2022-08-28 VITALS — BP 140/69 | HR 73 | Temp 98.3°F | Resp 16 | Wt 133.4 lb

## 2022-08-28 DIAGNOSIS — R300 Dysuria: Secondary | ICD-10-CM | POA: Diagnosis not present

## 2022-08-28 DIAGNOSIS — J329 Chronic sinusitis, unspecified: Secondary | ICD-10-CM

## 2022-08-28 LAB — POCT URINALYSIS DIPSTICK
Bilirubin, UA: NEGATIVE
Blood, UA: NEGATIVE
Glucose, UA: NEGATIVE
Ketones, UA: NEGATIVE
Leukocytes, UA: NEGATIVE
Nitrite, UA: NEGATIVE
Protein, UA: NEGATIVE
Spec Grav, UA: <=1.005 — AB (ref 1.010–1.025)
Urobilinogen, UA: 0.2 E.U./dL
pH, UA: 7.5 (ref 5.0–8.0)

## 2022-08-28 MED ORDER — CEFDINIR 300 MG PO CAPS: 600.0000 mg | ORAL_CAPSULE | Freq: Every day | ORAL | 0 refills | Status: AC

## 2022-08-30 LAB — URINE CULTURE: Organism ID, Bacteria: NO GROWTH

## 2022-09-23 ENCOUNTER — Other Ambulatory Visit: Payer: Self-pay | Admitting: Family Medicine

## 2022-09-23 DIAGNOSIS — J45909 Unspecified asthma, uncomplicated: Secondary | ICD-10-CM

## 2022-09-23 DIAGNOSIS — J301 Allergic rhinitis due to pollen: Secondary | ICD-10-CM

## 2022-09-23 DIAGNOSIS — I10 Essential (primary) hypertension: Secondary | ICD-10-CM

## 2022-09-24 NOTE — Telephone Encounter (Signed)
Requested Prescriptions  Pending Prescriptions Disp Refills   amLODipine (NORVASC) 5 MG tablet [Pharmacy Med Name: AMLODIPINE BESYLATE 5 MG TAB] 90 tablet 0    Sig: TAKE 1 TABLET BY MOUTH EVERY DAY     Cardiovascular: Calcium Channel Blockers 2 Failed - 09/23/2022  9:02 AM      Failed - Last BP in normal range    BP Readings from Last 1 Encounters:  08/28/22 (!) 140/69         Passed - Last Heart Rate in normal range    Pulse Readings from Last 1 Encounters:  08/28/22 73         Passed - Valid encounter within last 6 months    Recent Outpatient Visits           3 weeks ago [place], Donald E, MD   4 months ago Acute cough   [place], MD   8 months ago Paronychia of finger of right hand   [place], [place], [place]   9 months ago Primary hypertension   [place], Donald E, MD   12 months ago Otitis media with effusion, bilateral   [place] Mecum, Erin E, PA-C               montelukast (SINGULAIR) 10 MG tablet [Pharmacy Med Name: MONTELUKAST SOD 10 MG TABLET] 90 tablet 0    Sig: TAKE 1 TABLET BY MOUTH EVERY DAY     Pulmonology:  Leukotriene Inhibitors Passed - 09/23/2022  9:02 AM      Passed - Valid encounter within last 12 months    Recent Outpatient Visits           3 weeks ago [place], Donald E, MD   4 months ago Acute cough   [place], Donald E, MD   8 months ago Paronychia of finger of right hand   [place], [place], [place]   9 months ago Primary hypertension   [place] Birdie Sons, MD   12 months ago Otitis media with effusion, bilateral   [place] Mecum, Dani Gob[place], [place]

## 2022-10-08 DIAGNOSIS — M159 Polyosteoarthritis, unspecified: Secondary | ICD-10-CM | POA: Diagnosis not present

## 2022-10-08 DIAGNOSIS — M47816 Spondylosis without myelopathy or radiculopathy, lumbar region: Secondary | ICD-10-CM | POA: Diagnosis not present

## 2022-10-21 DIAGNOSIS — M778 Other enthesopathies, not elsewhere classified: Secondary | ICD-10-CM | POA: Diagnosis not present

## 2022-10-21 DIAGNOSIS — M2012 Hallux valgus (acquired), left foot: Secondary | ICD-10-CM | POA: Diagnosis not present

## 2022-10-21 DIAGNOSIS — M2041 Other hammer toe(s) (acquired), right foot: Secondary | ICD-10-CM | POA: Diagnosis not present

## 2022-10-21 DIAGNOSIS — M2011 Hallux valgus (acquired), right foot: Secondary | ICD-10-CM | POA: Diagnosis not present

## 2022-10-21 DIAGNOSIS — B351 Tinea unguium: Secondary | ICD-10-CM | POA: Diagnosis not present

## 2022-10-21 DIAGNOSIS — M898X9 Other specified disorders of bone, unspecified site: Secondary | ICD-10-CM | POA: Diagnosis not present

## 2022-10-31 DIAGNOSIS — H9209 Otalgia, unspecified ear: Secondary | ICD-10-CM | POA: Diagnosis not present

## 2022-10-31 DIAGNOSIS — H9319 Tinnitus, unspecified ear: Secondary | ICD-10-CM | POA: Diagnosis not present

## 2022-10-31 DIAGNOSIS — R040 Epistaxis: Secondary | ICD-10-CM | POA: Diagnosis not present

## 2022-10-31 DIAGNOSIS — H6063 Unspecified chronic otitis externa, bilateral: Secondary | ICD-10-CM | POA: Diagnosis not present

## 2022-10-31 DIAGNOSIS — R42 Dizziness and giddiness: Secondary | ICD-10-CM | POA: Diagnosis not present

## 2022-12-12 DIAGNOSIS — F411 Generalized anxiety disorder: Secondary | ICD-10-CM | POA: Diagnosis not present

## 2022-12-12 DIAGNOSIS — R42 Dizziness and giddiness: Secondary | ICD-10-CM | POA: Diagnosis not present

## 2022-12-12 DIAGNOSIS — G43411 Hemiplegic migraine, intractable, with status migrainosus: Secondary | ICD-10-CM | POA: Diagnosis not present

## 2022-12-12 DIAGNOSIS — E236 Other disorders of pituitary gland: Secondary | ICD-10-CM | POA: Diagnosis not present

## 2022-12-12 DIAGNOSIS — Z8739 Personal history of other diseases of the musculoskeletal system and connective tissue: Secondary | ICD-10-CM | POA: Diagnosis not present

## 2022-12-19 ENCOUNTER — Other Ambulatory Visit: Payer: Self-pay | Admitting: Family Medicine

## 2022-12-19 DIAGNOSIS — J45909 Unspecified asthma, uncomplicated: Secondary | ICD-10-CM

## 2022-12-19 DIAGNOSIS — I10 Essential (primary) hypertension: Secondary | ICD-10-CM

## 2022-12-19 DIAGNOSIS — J301 Allergic rhinitis due to pollen: Secondary | ICD-10-CM

## 2022-12-23 DIAGNOSIS — L820 Inflamed seborrheic keratosis: Secondary | ICD-10-CM | POA: Diagnosis not present

## 2022-12-23 DIAGNOSIS — L300 Nummular dermatitis: Secondary | ICD-10-CM | POA: Diagnosis not present

## 2022-12-23 DIAGNOSIS — D485 Neoplasm of uncertain behavior of skin: Secondary | ICD-10-CM | POA: Diagnosis not present

## 2022-12-23 DIAGNOSIS — L408 Other psoriasis: Secondary | ICD-10-CM | POA: Diagnosis not present

## 2022-12-23 DIAGNOSIS — R58 Hemorrhage, not elsewhere classified: Secondary | ICD-10-CM | POA: Diagnosis not present

## 2023-01-09 DIAGNOSIS — E785 Hyperlipidemia, unspecified: Secondary | ICD-10-CM | POA: Diagnosis not present

## 2023-01-09 DIAGNOSIS — M25551 Pain in right hip: Secondary | ICD-10-CM | POA: Diagnosis not present

## 2023-01-09 DIAGNOSIS — I10 Essential (primary) hypertension: Secondary | ICD-10-CM | POA: Diagnosis not present

## 2023-01-09 DIAGNOSIS — R002 Palpitations: Secondary | ICD-10-CM | POA: Diagnosis not present

## 2023-01-16 ENCOUNTER — Other Ambulatory Visit: Payer: Self-pay | Admitting: Family Medicine

## 2023-01-16 DIAGNOSIS — I10 Essential (primary) hypertension: Secondary | ICD-10-CM

## 2023-01-17 DIAGNOSIS — H26493 Other secondary cataract, bilateral: Secondary | ICD-10-CM | POA: Diagnosis not present

## 2023-01-17 DIAGNOSIS — M3501 Sicca syndrome with keratoconjunctivitis: Secondary | ICD-10-CM | POA: Diagnosis not present

## 2023-01-17 DIAGNOSIS — H02833 Dermatochalasis of right eye, unspecified eyelid: Secondary | ICD-10-CM | POA: Diagnosis not present

## 2023-01-17 DIAGNOSIS — H43813 Vitreous degeneration, bilateral: Secondary | ICD-10-CM | POA: Diagnosis not present

## 2023-01-20 DIAGNOSIS — M1711 Unilateral primary osteoarthritis, right knee: Secondary | ICD-10-CM | POA: Diagnosis not present

## 2023-01-20 DIAGNOSIS — M25561 Pain in right knee: Secondary | ICD-10-CM | POA: Diagnosis not present

## 2023-01-27 NOTE — Progress Notes (Unsigned)
I,Sulibeya S Dimas,acting as a scribe for Mila Merry, MD.,have documented all relevant documentation on the behalf of Mila Merry, MD,as directed by  Mila Merry, MD   Established patient visit   Patient: Michelle Parrish   DOB: Sep 27, 1940   82 y.o. Female  MRN: [id number] Visit Date: 01/28/2023  Today's healthcare provider: Mila Merry, MD   Chief Complaint  Patient presents with   Hypertension   Hyperlipidemia   Subjective    HPI  Hypertension, follow-up  BP Readings from Last 3 Encounters:  01/28/23 113/66  08/28/22 (!) 140/69  05/24/22 128/75   Wt Readings from Last 3 Encounters:  01/28/23 133 lb 3.2 oz (60.4 kg)  08/28/22 133 lb 6.4 oz (60.5 kg)  01/08/22 131 lb 1.6 oz (59.5 kg)     She was last seen for hypertension 8 months ago.  BP at that visit was 128/75. Management since that visit includes no changes. She reports excellent compliance with treatment. She is not having side effects.   Outside blood pressures are stable.   --------------------------------------------------------------------------------------------------- Lipid/Cholesterol, follow-up  Last Lipid Panel: Lab Results  Component Value Date   CHOL 195 06/10/2022   LDLCALC 123 (H) 06/10/2022   HDL 52 06/10/2022   TRIG 109 06/10/2022    She was last seen for this 8 months ago.  Management since that visit includes no changes.  Not on oral statins.   Last metabolic panel Lab Results  Component Value Date   GLUCOSE 82 06/10/2022   NA 136 06/10/2022   K 4.5 06/10/2022   BUN 6 (L) 06/10/2022   CREATININE 0.66 06/10/2022   EGFR 89 06/10/2022   GFRNONAA 89 06/15/2020   CALCIUM 9.4 06/10/2022   AST 22 06/10/2022   ALT 9 06/10/2022   The ASCVD Risk score (Arnett DK, et al., 2019) failed to calculate for the following reasons:   The 2019 ASCVD risk score is only valid for ages 30 to  34  ---------------------------------------------------------------------------------------------------   Medications: Outpatient Medications Prior to Visit  Medication Sig   albuterol (VENTOLIN HFA) 108 (90 Base) MCG/ACT inhaler INHALE 2 PUFFS INTO THE LUNGS EVERY 6 (SIX) HOURS AS NEEDED FOR WHEEZING OR SHORTNESS OF BREATH. SEASONALLY FOR ASTHMA   amLODipine (NORVASC) 5 MG tablet TAKE 1 TABLET BY MOUTH EVERY DAY   EPIPEN 2-PAK 0.3 MG/0.3ML SOAJ injection INJECT INTO THIGH MUSCLE AS NEEDED FOR ANAPHYLAXIS.   fluticasone-salmeterol (WIXELA INHUB) 100-50 MCG/ACT AEPB Inhale 1 puff into the lungs 2 (two) times daily.   gabapentin (NEURONTIN) 100 MG capsule Take 2 capsules by mouth Nightly.   lisinopril-hydrochlorothiazide (ZESTORETIC) 20-12.5 MG tablet TAKE 1 TABLET BY MOUTH EVERY DAY   metoprolol succinate (TOPROL-XL) 100 MG 24 hr tablet TAKE 1 TABLET BY MOUTH EVERY DAY WITH OR IMMEDIATELY FOLLOWING A MEAL   montelukast (SINGULAIR) 10 MG tablet TAKE 1 TABLET BY MOUTH EVERY DAY   Multiple Vitamin (MULTIVITAMIN) tablet Take 1 tablet by mouth daily.   Vitamin D, Ergocalciferol, (DRISDOL) 1.25 MG (50000 UNIT) CAPS capsule TAKE 1 CAPSULE (50,000 UNITS TOTAL) BY MOUTH EVERY 30 DAYS   risedronate (ACTONEL) 35 MG tablet TAKE 1 TAB BY MOUTH EVERY 7 DAYS WITH WATER ON EMPTY STOMACH, DO NOT LIE DOWN FOR AFTER (Patient not taking: Reported on 01/28/2023)   No facility-administered medications prior to visit.    Review of Systems  Eyes:  Negative for visual disturbance.  Respiratory:  Negative for cough, chest tightness and shortness of breath.   Cardiovascular:  Positive for  palpitations and leg swelling. Negative for chest pain.  Gastrointestinal:  Negative for abdominal pain, nausea and vomiting.  Neurological:  Negative for dizziness, light-headedness and headaches.       Objective    BP 113/66 (BP Location: Left Arm, Patient Position: Sitting, Cuff Size: Normal)   Pulse 69   Temp 98.2  F (36.8 C) (Temporal)   Resp 12   Ht 5\' 6"  (1.676 m)   Wt 133 lb 3.2 oz (60.4 kg)   SpO2 99%   BMI 21.50 kg/m  BP Readings from Last 3 Encounters:  01/28/23 113/66  08/28/22 (!) 140/69  05/24/22 128/75    Physical Exam  General appearance: Well developed, well nourished female, cooperative and in no acute distress Head: Normocephalic, without obvious abnormality, atraumatic Respiratory: Respirations even and unlabored, normal respiratory rate Extremities: All extremities are intact.  Skin: Skin color, texture, turgor normal. No rashes seen  Psych: Appropriate mood and affect. Neurologic: Mental status: Alert, oriented to person, place, and time, thought content appropriate.     Assessment & Plan     1. Primary hypertension Well controlled.  .ccm   2. Avitaminosis D Doing well on vitamin D supplementation.   3. Age-related osteoporosis without current pathological fracture Not on bisphosphonate. Is due for follow up BMD. She would like to hold of for now since her husband has been diagnoses with lung cancer and elected not to treat.   Future Appointments  Date Time Provider Department Center  06/25/2023 10:00 AM Sherrie Mustache, Demetrios Isaacs, MD [facility]         The entirety of the information documented in the History of Present Illness, Review of Systems and Physical Exam were personally obtained by me. Portions of this information were initially documented by the CMA and reviewed by me for thoroughness and accuracy.     Mila Merry, MD  [facility] [phone] (phone) [phone] (fax)  [facility]

## 2023-01-28 ENCOUNTER — Ambulatory Visit (INDEPENDENT_AMBULATORY_CARE_PROVIDER_SITE_OTHER): Payer: PPO | Admitting: Family Medicine

## 2023-01-28 VITALS — BP 113/66 | HR 69 | Temp 98.2°F | Resp 12 | Ht 66.0 in | Wt 133.2 lb

## 2023-01-28 DIAGNOSIS — I10 Essential (primary) hypertension: Secondary | ICD-10-CM

## 2023-01-28 DIAGNOSIS — M810 Age-related osteoporosis without current pathological fracture: Secondary | ICD-10-CM | POA: Diagnosis not present

## 2023-01-28 DIAGNOSIS — E559 Vitamin D deficiency, unspecified: Secondary | ICD-10-CM | POA: Diagnosis not present

## 2023-01-28 NOTE — Patient Instructions (Signed)
.   Please review the attached list of medications and notify my office if there are any errors.   . Please bring all of your medications to every appointment so we can make sure that our medication list is the same as yours.   

## 2023-04-09 ENCOUNTER — Telehealth: Payer: Self-pay | Admitting: Family Medicine

## 2023-04-09 DIAGNOSIS — M159 Polyosteoarthritis, unspecified: Secondary | ICD-10-CM | POA: Diagnosis not present

## 2023-04-09 DIAGNOSIS — M791 Myalgia, unspecified site: Secondary | ICD-10-CM | POA: Diagnosis not present

## 2023-04-09 DIAGNOSIS — L409 Psoriasis, unspecified: Secondary | ICD-10-CM | POA: Diagnosis not present

## 2023-04-09 DIAGNOSIS — M47816 Spondylosis without myelopathy or radiculopathy, lumbar region: Secondary | ICD-10-CM | POA: Diagnosis not present

## 2023-04-09 NOTE — Telephone Encounter (Signed)
Contacted Michelle Parrish to schedule their annual wellness visit. Patient declined to schedule AWV at this time.  Thank you,  [facility] Support [facility] Direct dial  [phone]

## 2023-04-16 ENCOUNTER — Ambulatory Visit: Payer: Self-pay

## 2023-04-16 MED ORDER — MECLIZINE HCL 25 MG PO TABS: 25.0000 mg | ORAL_TABLET | Freq: Three times a day (TID) | ORAL | 2 refills | Status: AC | PRN
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
## 2023-05-05 DIAGNOSIS — L821 Other seborrheic keratosis: Secondary | ICD-10-CM | POA: Diagnosis not present

## 2023-05-05 DIAGNOSIS — Z85828 Personal history of other malignant neoplasm of skin: Secondary | ICD-10-CM | POA: Diagnosis not present

## 2023-05-05 DIAGNOSIS — L565 Disseminated superficial actinic porokeratosis (DSAP): Secondary | ICD-10-CM | POA: Diagnosis not present

## 2023-05-05 DIAGNOSIS — L570 Actinic keratosis: Secondary | ICD-10-CM | POA: Diagnosis not present

## 2023-05-05 DIAGNOSIS — R208 Other disturbances of skin sensation: Secondary | ICD-10-CM | POA: Diagnosis not present

## 2023-05-05 DIAGNOSIS — D225 Melanocytic nevi of trunk: Secondary | ICD-10-CM | POA: Diagnosis not present

## 2023-05-05 DIAGNOSIS — L538 Other specified erythematous conditions: Secondary | ICD-10-CM | POA: Diagnosis not present

## 2023-05-05 DIAGNOSIS — L820 Inflamed seborrheic keratosis: Secondary | ICD-10-CM | POA: Diagnosis not present

## 2023-05-05 DIAGNOSIS — Z08 Encounter for follow-up examination after completed treatment for malignant neoplasm: Secondary | ICD-10-CM | POA: Diagnosis not present

## 2023-05-12 ENCOUNTER — Encounter: Payer: Self-pay | Admitting: Family Medicine

## 2023-05-12 DIAGNOSIS — R42 Dizziness and giddiness: Secondary | ICD-10-CM | POA: Diagnosis not present

## 2023-05-12 DIAGNOSIS — H8191 Unspecified disorder of vestibular function, right ear: Secondary | ICD-10-CM | POA: Diagnosis not present

## 2023-06-25 ENCOUNTER — Ambulatory Visit: Payer: PPO | Admitting: Family Medicine

## 2023-06-25 VITALS — BP 137/77 | HR 68 | Temp 98.3°F | Ht 66.0 in | Wt 132.0 lb

## 2023-06-25 DIAGNOSIS — Z23 Encounter for immunization: Secondary | ICD-10-CM | POA: Diagnosis not present

## 2023-06-25 DIAGNOSIS — I10 Essential (primary) hypertension: Secondary | ICD-10-CM

## 2023-06-25 DIAGNOSIS — F439 Reaction to severe stress, unspecified: Secondary | ICD-10-CM

## 2023-06-25 DIAGNOSIS — E785 Hyperlipidemia, unspecified: Secondary | ICD-10-CM

## 2023-06-25 DIAGNOSIS — G4700 Insomnia, unspecified: Secondary | ICD-10-CM | POA: Diagnosis not present

## 2023-06-25 DIAGNOSIS — E559 Vitamin D deficiency, unspecified: Secondary | ICD-10-CM | POA: Diagnosis not present

## 2023-06-25 DIAGNOSIS — R350 Frequency of micturition: Secondary | ICD-10-CM

## 2023-06-25 LAB — POCT URINALYSIS DIPSTICK
Bilirubin, UA: NEGATIVE
Blood, UA: NEGATIVE
Glucose, UA: NEGATIVE
Ketones, UA: NEGATIVE
Leukocytes, UA: NEGATIVE
Nitrite, UA: NEGATIVE
Protein, UA: NEGATIVE
Spec Grav, UA: 1.015 (ref 1.010–1.025)
Urobilinogen, UA: 0.2 U/dL
pH, UA: 6 (ref 5.0–8.0)

## 2023-06-25 MED ORDER — ALPRAZOLAM 0.25 MG PO TABS
0.2500 mg | ORAL_TABLET | Freq: Four times a day (QID) | ORAL | 1 refills | Status: DC | PRN
Start: 2023-06-25 — End: 2023-06-30
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
## 2023-06-26 ENCOUNTER — Encounter: Payer: Self-pay | Admitting: Family Medicine

## 2023-06-26 LAB — COMPREHENSIVE METABOLIC PANEL
ALT: 9 [IU]/L (ref 0–32)
AST: 26 [IU]/L (ref 0–40)
Albumin: 4.7 g/dL (ref 3.8–4.8)
Alkaline Phosphatase: 70 [IU]/L (ref 44–121)
BUN/Creatinine Ratio: 13 (ref 12–28)
BUN: 9 mg/dL (ref 8–27)
Bilirubin Total: 0.7 mg/dL (ref 0.0–1.2)
CO2: 24 mmol/L (ref 20–29)
Calcium: 10.1 mg/dL (ref 8.7–10.3)
Chloride: 93 mmol/L — ABNORMAL LOW (ref 96–106)
Creatinine, Ser: 0.7 mg/dL (ref 0.57–1.00)
Globulin, Total: 2.3 g/dL (ref 1.5–4.5)
Glucose: 92 mg/dL (ref 70–99)
Potassium: 4.3 mmol/L (ref 3.5–5.2)
Sodium: 131 mmol/L — ABNORMAL LOW (ref 134–144)
Total Protein: 7 g/dL (ref 6.0–8.5)
eGFR: 87 mL/min/{1.73_m2} (ref >59)

## 2023-06-26 LAB — CBC
Hematocrit: 43.4 % (ref 34.0–46.6)
Hemoglobin: 14.6 g/dL (ref 11.1–15.9)
MCH: 29 pg (ref 26.6–33.0)
MCHC: 33.6 g/dL (ref 31.5–35.7)
MCV: 86 fL (ref 79–97)
Platelets: 385 10*3/uL (ref 150–450)
RBC: 5.03 x10E6/uL (ref 3.77–5.28)
RDW: 12.4 % (ref 11.7–15.4)
WBC: 8.6 10*3/uL (ref 3.4–10.8)

## 2023-06-26 LAB — LIPID PANEL
Chol/HDL Ratio: 3.8 ratio (ref 0.0–4.4)
Cholesterol, Total: 221 mg/dL — ABNORMAL HIGH (ref 100–199)
HDL: 58 mg/dL (ref >39)
LDL Chol Calc (NIH): 140 mg/dL — ABNORMAL HIGH (ref 0–99)
Triglycerides: 128 mg/dL (ref 0–149)
VLDL Cholesterol Cal: 23 mg/dL (ref 5–40)

## 2023-06-26 LAB — VITAMIN D 25 HYDROXY (VIT D DEFICIENCY, FRACTURES): Vit D, 25-Hydroxy: 69.3 ng/mL (ref 30.0–100.0)

## 2023-06-27 ENCOUNTER — Encounter: Payer: Self-pay | Admitting: Family Medicine

## 2023-06-27 DIAGNOSIS — G4700 Insomnia, unspecified: Secondary | ICD-10-CM

## 2023-06-30 MED ORDER — ALPRAZOLAM 0.25 MG PO TABS: 0.2500 mg | ORAL_TABLET | Freq: Four times a day (QID) | ORAL | 1 refills | Status: DC | PRN

## 2023-07-02 ENCOUNTER — Other Ambulatory Visit: Payer: Self-pay | Admitting: Family Medicine

## 2023-07-02 DIAGNOSIS — Z1231 Encounter for screening mammogram for malignant neoplasm of breast: Secondary | ICD-10-CM

## 2023-07-22 ENCOUNTER — Other Ambulatory Visit: Payer: Self-pay | Admitting: Family Medicine

## 2023-07-22 MED ORDER — VITAMIN D (ERGOCALCIFEROL) 1.25 MG (50000 UNIT) PO CAPS: ORAL_CAPSULE | ORAL | 2 refills | Status: DC

## 2023-07-24 ENCOUNTER — Other Ambulatory Visit: Payer: Self-pay | Admitting: Family Medicine

## 2023-07-24 ENCOUNTER — Ambulatory Visit
Admission: RE | Admit: 2023-07-24 | Discharge: 2023-07-24 | Disposition: A | Discharge status: Home / Self Care | Payer: PPO | Source: Ambulatory Visit | Attending: Family Medicine | Admitting: Family Medicine

## 2023-07-24 DIAGNOSIS — Z1231 Encounter for screening mammogram for malignant neoplasm of breast: Secondary | ICD-10-CM | POA: Insufficient documentation

## 2023-09-01 ENCOUNTER — Other Ambulatory Visit: Payer: Self-pay | Admitting: Family Medicine

## 2023-09-01 DIAGNOSIS — J45909 Unspecified asthma, uncomplicated: Secondary | ICD-10-CM

## 2023-09-01 DIAGNOSIS — J301 Allergic rhinitis due to pollen: Secondary | ICD-10-CM

## 2023-09-18 DIAGNOSIS — B351 Tinea unguium: Secondary | ICD-10-CM | POA: Diagnosis not present

## 2023-09-18 DIAGNOSIS — M898X9 Other specified disorders of bone, unspecified site: Secondary | ICD-10-CM | POA: Diagnosis not present

## 2023-09-18 DIAGNOSIS — M2012 Hallux valgus (acquired), left foot: Secondary | ICD-10-CM | POA: Diagnosis not present

## 2023-09-18 DIAGNOSIS — M2041 Other hammer toe(s) (acquired), right foot: Secondary | ICD-10-CM | POA: Diagnosis not present

## 2023-09-18 DIAGNOSIS — M2011 Hallux valgus (acquired), right foot: Secondary | ICD-10-CM | POA: Diagnosis not present

## 2023-10-21 ENCOUNTER — Ambulatory Visit: Payer: Self-pay | Admitting: Family Medicine

## 2023-10-28 ENCOUNTER — Ambulatory Visit (INDEPENDENT_AMBULATORY_CARE_PROVIDER_SITE_OTHER): Payer: PPO | Admitting: Family Medicine

## 2023-10-28 ENCOUNTER — Encounter: Payer: Self-pay | Admitting: Family Medicine

## 2023-10-28 VITALS — BP 132/74 | HR 72 | Temp 98.2°F | Resp 16 | Ht 66.0 in | Wt 135.4 lb

## 2023-10-28 DIAGNOSIS — E871 Hypo-osmolality and hyponatremia: Secondary | ICD-10-CM | POA: Diagnosis not present

## 2023-10-28 DIAGNOSIS — I10 Essential (primary) hypertension: Secondary | ICD-10-CM

## 2023-10-28 DIAGNOSIS — M810 Age-related osteoporosis without current pathological fracture: Secondary | ICD-10-CM

## 2023-10-28 NOTE — Progress Notes (Unsigned)
 Established patient visit   Patient: Michelle Parrish   DOB: Jan 04, 1941   83 y.o. Female  MRN: [id number] Visit Date: 10/28/2023  Today's healthcare provider: Mila Merry, MD   Chief Complaint  Patient presents with   Medical Management of Chronic Issues    Follow-up HTN and Sodium levels   Hypertension   Subjective    Hypertension Pertinent negatives include no chest pain, palpitations or shortness of breath.   Follow up htn and hyponatremia. Feels well. No complaints. Home Bps normal. Taking meds consistently.   Lab Results  Component Value Date   NA 131 (L) 06/25/2023   CL 93 (L) 06/25/2023   K 4.3 06/25/2023   CO2 24 06/25/2023   BUN 9 06/25/2023   CREATININE 0.70 06/25/2023   EGFR 87 06/25/2023   CALCIUM 10.1 06/25/2023   PHOS 4.1 08/03/2019   ALBUMIN 4.7 06/25/2023   GLUCOSE 92 06/25/2023   Lab Results  Component Value Date   TSH 2.500 12/14/2019     Medications: Outpatient Medications Prior to Visit  Medication Sig   albuterol (VENTOLIN HFA) 108 (90 Base) MCG/ACT inhaler INHALE 2 PUFFS INTO THE LUNGS EVERY 6 (SIX) HOURS AS NEEDED FOR WHEEZING OR SHORTNESS OF BREATH. SEASONALLY FOR ASTHMA   ALPRAZolam (XANAX) 0.25 MG tablet Take 1 tablet (0.25 mg total) by mouth every 6 (six) hours as needed for anxiety.   amLODipine (NORVASC) 5 MG tablet TAKE 1 TABLET BY MOUTH EVERY DAY   EPIPEN 2-PAK 0.3 MG/0.3ML SOAJ injection INJECT INTO THIGH MUSCLE AS NEEDED FOR ANAPHYLAXIS.   fluticasone-salmeterol (WIXELA INHUB) 100-50 MCG/ACT AEPB Inhale 1 puff into the lungs 2 (two) times daily.   gabapentin (NEURONTIN) 100 MG capsule Take 2 capsules by mouth Nightly.   lisinopril-hydrochlorothiazide (ZESTORETIC) 20-12.5 MG tablet TAKE 1 TABLET BY MOUTH EVERY DAY   meclizine (ANTIVERT) 25 MG tablet Take 1 tablet (25 mg total) by mouth 3 (three) times daily as needed for dizziness.   metoprolol succinate (TOPROL-XL) 100 MG 24 hr tablet TAKE 1 TABLET BY MOUTH EVERY DAY WITH OR  IMMEDIATELY FOLLOWING A MEAL   mometasone (ELOCON) 0.1 % ointment    montelukast (SINGULAIR) 10 MG tablet TAKE 1 TABLET BY MOUTH EVERY DAY   Multiple Vitamin (MULTIVITAMIN) tablet Take 1 tablet by mouth daily.   SUMAtriptan (IMITREX) 25 MG tablet Take 25 mg by mouth every 2 (two) hours as needed for migraine.   Vitamin D, Ergocalciferol, (DRISDOL) 1.25 MG (50000 UNIT) CAPS capsule TAKE 1 CAPSULE (50,000 UNITS TOTAL) BY MOUTH EVERY 30 DAYS   No facility-administered medications prior to visit.    Review of Systems  Constitutional:  Negative for appetite change, chills, fatigue and fever.  Respiratory:  Negative for chest tightness and shortness of breath.   Cardiovascular:  Negative for chest pain and palpitations.  Gastrointestinal:  Negative for abdominal pain, nausea and vomiting.  Neurological:  Negative for dizziness and weakness.   {Insert previous labs (optional):23779} {See past labs  Heme  Chem  Endocrine  Serology  Results Review (optional):1}   Objective    BP 132/74 (BP Location: Left Arm, Patient Position: Sitting, Cuff Size: Normal)   Pulse 72   Temp 98.2 F (36.8 C) (Oral)   Resp 16   Ht 5\' 6"  (1.676 m)   Wt 135 lb 6.4 oz (61.4 kg)   SpO2 100%   BMI 21.85 kg/m  {Insert last BP/Wt (optional):23777}{See vitals history (optional):1}  Physical Exam   General appearance: Well  developed, well nourished female, cooperative and in no acute distress Head: Normocephalic, without obvious abnormality, atraumatic Respiratory: Respirations even and unlabored, normal respiratory rate Extremities: All extremities are intact.  Skin: Skin color, texture, turgor normal. No rashes seen  Psych: Appropriate mood and affect. Neurologic: Mental status: Alert, oriented to person, place, and time, thought content appropriate.    Assessment & Plan     1. Primary hypertension (Primary) Well controlled.  .  2. Hyponatremia Likely secondary to hydrochlorothiazide.  - TSH -  Renal function panel  3. Age-related osteoporosis without current pathological fracture On vitamin D. Given number to [facility] to schedule BMD        Mila Merry, MD  [facility] [phone] (phone) [phone] (fax)  [facility]

## 2023-10-29 ENCOUNTER — Encounter: Payer: Self-pay | Admitting: Family Medicine

## 2023-10-29 LAB — RENAL FUNCTION PANEL
Albumin: 4.7 g/dL (ref 3.8–4.8)
BUN/Creatinine Ratio: 14 (ref 12–28)
BUN: 10 mg/dL (ref 8–27)
CO2: 25 mmol/L (ref 20–29)
Calcium: 9.9 mg/dL (ref 8.7–10.3)
Chloride: 93 mmol/L — ABNORMAL LOW (ref 96–106)
Creatinine, Ser: 0.72 mg/dL (ref 0.57–1.00)
Glucose: 84 mg/dL (ref 70–99)
Phosphorus: 4.4 mg/dL — ABNORMAL HIGH (ref 3.0–4.3)
Potassium: 4.4 mmol/L (ref 3.5–5.2)
Sodium: 133 mmol/L — ABNORMAL LOW (ref 134–144)
eGFR: 84 mL/min/{1.73_m2} (ref >59)

## 2023-10-29 LAB — TSH: TSH: 2.51 u[IU]/mL (ref 0.450–4.500)

## 2023-11-04 DIAGNOSIS — R040 Epistaxis: Secondary | ICD-10-CM | POA: Diagnosis not present

## 2023-11-04 DIAGNOSIS — R42 Dizziness and giddiness: Secondary | ICD-10-CM | POA: Diagnosis not present

## 2023-11-12 ENCOUNTER — Ambulatory Visit: Payer: PPO | Admitting: Family Medicine

## 2023-11-18 ENCOUNTER — Encounter: Payer: Self-pay | Admitting: Family Medicine

## 2023-11-18 DIAGNOSIS — J45909 Unspecified asthma, uncomplicated: Secondary | ICD-10-CM

## 2023-11-18 MED ORDER — ALBUTEROL SULFATE HFA 108 (90 BASE) MCG/ACT IN AERS: 2.0000 | INHALATION_SPRAY | Freq: Four times a day (QID) | RESPIRATORY_TRACT | 4 refills | Status: DC | PRN

## 2023-11-20 ENCOUNTER — Encounter: Payer: Self-pay | Admitting: Family Medicine

## 2023-11-20 ENCOUNTER — Ambulatory Visit (INDEPENDENT_AMBULATORY_CARE_PROVIDER_SITE_OTHER): Admitting: Family Medicine

## 2023-11-20 VITALS — BP 138/81 | HR 71 | Ht 66.0 in | Wt 134.7 lb

## 2023-11-20 DIAGNOSIS — J45909 Unspecified asthma, uncomplicated: Secondary | ICD-10-CM

## 2023-11-20 DIAGNOSIS — R399 Unspecified symptoms and signs involving the genitourinary system: Secondary | ICD-10-CM | POA: Diagnosis not present

## 2023-11-20 DIAGNOSIS — Z87440 Personal history of urinary (tract) infections: Secondary | ICD-10-CM | POA: Diagnosis not present

## 2023-11-20 LAB — POCT URINALYSIS DIPSTICK
Bilirubin, UA: NEGATIVE
Blood, UA: NEGATIVE
Glucose, UA: NEGATIVE
Ketones, UA: NEGATIVE
Nitrite, UA: NEGATIVE
Protein, UA: NEGATIVE
Spec Grav, UA: 1.01 (ref 1.010–1.025)
Urobilinogen, UA: 0.2 U/dL
pH, UA: 6.5 (ref 5.0–8.0)

## 2023-11-20 MED ORDER — CEPHALEXIN 500 MG PO CAPS: 500.0000 mg | ORAL_CAPSULE | Freq: Two times a day (BID) | ORAL | 0 refills | Status: AC

## 2023-11-20 MED ORDER — CYSTEX URINARY HEALTH PO LIQD: 15.0000 mL | Freq: Every day | ORAL | 6 refills | Status: AC

## 2023-11-20 MED ORDER — FLUTICASONE-SALMETEROL 100-50 MCG/ACT IN AEPB: 1.0000 | INHALATION_SPRAY | Freq: Two times a day (BID) | RESPIRATORY_TRACT | 5 refills | Status: AC

## 2023-11-20 NOTE — Progress Notes (Signed)
 Established patient visit   Patient: Michelle Parrish   DOB: [birth year]   83 y.o. Female  MRN: [id number] Visit Date: 11/20/2023  Today's healthcare provider: Sherlyn Hay, DO   Chief Complaint  Patient presents with   Urinary Tract Infection    Started over the weekend Took azo for couple days to help with burning and has run out of those Tuesday morning Has been going frequently and urine has been dark. Pain score is 5/10    Subjective    Urinary Tract Infection  Associated symptoms include chills, frequency and urgency.   Michelle Parrish is an 83 year old female who presents with urinary symptoms including pain, urgency, and dark urine.  She experiences dysuria, increased urinary frequency, urgency, and dark urine. She has been attempting to increase her fluid intake, including water and cranberry juice, to alleviate symptoms. The burning sensations began over the weekend, and she took Azo until she ran out on Tuesday. Despite this, she awoke on Wednesday morning with severe burning and dark urine, necessitating frequent bathroom visits.  She wears a thick incontinence pad at night, which remains dry until she gets up in the morning, and she changes the pad promptly after use. She has experienced similar symptoms in the past, but previous tests did not show any infection. She used a test strip from [facility], which indicated an infection by turning purple.  She reports chills but no back pain or abdominal pain, only experiencing suprapubic pressure. She does not recall having chills with previous episodes. She checked her temperature last night due to feeling warmer, but it was normal. No significant changes in activity level, appetite, or fatigue, attributing her tiredness to caring for her husband with lung cancer.  Her medication history includes allergies to sulfa and doxycycline, but no known allergies to penicillin or Keflex. She has previously taken Keflex without issues.  She also uses Advair and ProAir for asthma, with a recent request for a refill on Advair.      Medications: Outpatient Medications Prior to Visit  Medication Sig   albuterol (VENTOLIN HFA) 108 (90 Base) MCG/ACT inhaler Inhale 2 puffs into the lungs every 6 (six) hours as needed for wheezing or shortness of breath. SEASONALLY FOR ASTHMA   ALPRAZolam (XANAX) 0.25 MG tablet Take 1 tablet (0.25 mg total) by mouth every 6 (six) hours as needed for anxiety.   amLODipine (NORVASC) 5 MG tablet TAKE 1 TABLET BY MOUTH EVERY DAY   EPIPEN 2-PAK 0.3 MG/0.3ML SOAJ injection INJECT INTO THIGH MUSCLE AS NEEDED FOR ANAPHYLAXIS.   gabapentin (NEURONTIN) 100 MG capsule Take 2 capsules by mouth Nightly.   lisinopril-hydrochlorothiazide (ZESTORETIC) 20-12.5 MG tablet TAKE 1 TABLET BY MOUTH EVERY DAY   meclizine (ANTIVERT) 25 MG tablet Take 1 tablet (25 mg total) by mouth 3 (three) times daily as needed for dizziness.   metoprolol succinate (TOPROL-XL) 100 MG 24 hr tablet TAKE 1 TABLET BY MOUTH EVERY DAY WITH OR IMMEDIATELY FOLLOWING A MEAL   mometasone (ELOCON) 0.1 % ointment    montelukast (SINGULAIR) 10 MG tablet TAKE 1 TABLET BY MOUTH EVERY DAY   Multiple Vitamin (MULTIVITAMIN) tablet Take 1 tablet by mouth daily.   SUMAtriptan (IMITREX) 25 MG tablet Take 25 mg by mouth every 2 (two) hours as needed for migraine.   Vitamin D, Ergocalciferol, (DRISDOL) 1.25 MG (50000 UNIT) CAPS capsule TAKE 1 CAPSULE (50,000 UNITS TOTAL) BY MOUTH EVERY 30 DAYS   [DISCONTINUED] fluticasone-salmeterol (WIXELA INHUB)  100-50 MCG/ACT AEPB Inhale 1 puff into the lungs 2 (two) times daily.   No facility-administered medications prior to visit.    Review of Systems  Constitutional:  Positive for chills. Negative for fever.  Genitourinary:  Positive for dysuria, frequency and urgency.       +dark urine +suprapubic pressure        Objective    BP 138/81   Pulse 71   Ht 5\' 6"  (1.676 m)   Wt 134 lb 11.2 oz (61.1 kg)    SpO2 100%   BMI 21.74 kg/m     Physical Exam Vitals and nursing note reviewed.  Constitutional:      General: She is not in acute distress.    Appearance: Normal appearance.  HENT:     Head: Normocephalic and atraumatic.  Eyes:     General: No scleral icterus.    Conjunctiva/sclera: Conjunctivae normal.  Cardiovascular:     Rate and Rhythm: Normal rate.  Pulmonary:     Effort: Pulmonary effort is normal.  Abdominal:     Palpations: Abdomen is soft.     Tenderness: There is no abdominal tenderness. There is no right CVA tenderness or left CVA tenderness.  Neurological:     Mental Status: She is alert and oriented to person, place, and time. Mental status is at baseline.  Psychiatric:        Mood and Affect: Mood normal.        Behavior: Behavior normal.      Results for orders placed or performed in visit on 11/20/23  POCT urinalysis dipstick  Result Value Ref Range   Color, UA yellow    Clarity, UA clear    Glucose, UA Negative Negative   Bilirubin, UA negative    Ketones, UA negative    Spec Grav, UA 1.010 1.010 - 1.025   Blood, UA negative    pH, UA 6.5 5.0 - 8.0   Protein, UA Negative Negative   Urobilinogen, UA 0.2 0.2 or 1.0 E.U./dL   Nitrite, UA negative    Leukocytes, UA Moderate (2+) (A) Negative   Appearance     Odor      Assessment & Plan    UTI symptoms -     POCT urinalysis dipstick -     Urine Culture -     Cystex Urinary Health; Take 15 mLs by mouth daily.  Dispense: 225 mL; Refill: 6 -     Cephalexin; Take 1 capsule (500 mg total) by mouth 2 (two) times daily for 5 days.  Dispense: 10 capsule; Refill: 0  History of recurrent UTIs -     Cystex Urinary Health; Take 15 mLs by mouth daily.  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  Dispense: 60 each; Refill: 5   Urinary Tract Infection (UTI) Symptoms and urine analysis confirm  UTI. Allergies limit antibiotic options. She prefers to avoid antibiotics unless necessary. - Send urine for culture. - Prescribe Keflex. - Discussed cranberry extract and D-mannose as preventive measures.  Asthma Uses Advair and ProAir effectively. No current exacerbation. Open to generic Wixela. - Refill Wixela prescription. - Ensure ProAir prescription is filled.   Return if symptoms worsen or fail to improve.      I discussed the assessment and treatment plan with the patient  The patient was provided an opportunity to ask questions and all were  answered. The patient agreed with the plan and demonstrated an understanding of the instructions.   The patient was advised to call back or seek an in-person evaluation if the symptoms worsen or if the condition fails to improve as anticipated.    Sherlyn Hay, DO  [facility] [phone] (phone) [phone] (fax)  [facility]

## 2023-11-24 LAB — URINE CULTURE

## 2023-11-25 ENCOUNTER — Encounter: Payer: Self-pay | Admitting: Family Medicine

## 2023-12-01 ENCOUNTER — Other Ambulatory Visit: Payer: Self-pay | Admitting: Family Medicine

## 2023-12-01 DIAGNOSIS — I10 Essential (primary) hypertension: Secondary | ICD-10-CM

## 2023-12-08 DIAGNOSIS — M150 Primary generalized (osteo)arthritis: Secondary | ICD-10-CM | POA: Diagnosis not present

## 2023-12-08 DIAGNOSIS — L409 Psoriasis, unspecified: Secondary | ICD-10-CM | POA: Diagnosis not present

## 2023-12-08 DIAGNOSIS — M47816 Spondylosis without myelopathy or radiculopathy, lumbar region: Secondary | ICD-10-CM | POA: Diagnosis not present

## 2023-12-09 ENCOUNTER — Other Ambulatory Visit

## 2023-12-11 DIAGNOSIS — E236 Other disorders of pituitary gland: Secondary | ICD-10-CM | POA: Diagnosis not present

## 2023-12-11 DIAGNOSIS — R42 Dizziness and giddiness: Secondary | ICD-10-CM | POA: Diagnosis not present

## 2023-12-11 DIAGNOSIS — G43411 Hemiplegic migraine, intractable, with status migrainosus: Secondary | ICD-10-CM | POA: Diagnosis not present

## 2023-12-25 ENCOUNTER — Ambulatory Visit
Admission: RE | Admit: 2023-12-25 | Discharge: 2023-12-25 | Disposition: A | Discharge status: Home / Self Care | Source: Ambulatory Visit | Attending: Family Medicine | Admitting: Family Medicine

## 2023-12-25 DIAGNOSIS — M810 Age-related osteoporosis without current pathological fracture: Secondary | ICD-10-CM | POA: Insufficient documentation

## 2023-12-25 DIAGNOSIS — Z780 Asymptomatic menopausal state: Secondary | ICD-10-CM | POA: Diagnosis not present

## 2023-12-30 ENCOUNTER — Ambulatory Visit: Payer: Self-pay

## 2023-12-31 MED ORDER — ALENDRONATE SODIUM 70 MG PO TABS
70.0000 mg | ORAL_TABLET | ORAL | 11 refills | Status: AC
Start: 2023-12-31 — End: ?

## 2023-12-31 NOTE — Addendum Note (Signed)
 Addended by: Lamon Pillow on: 12/31/2023 02:22 PM   Modules accepted: Orders

## 2024-01-21 DIAGNOSIS — M3501 Sicca syndrome with keratoconjunctivitis: Secondary | ICD-10-CM | POA: Diagnosis not present

## 2024-01-21 DIAGNOSIS — H26493 Other secondary cataract, bilateral: Secondary | ICD-10-CM | POA: Diagnosis not present

## 2024-01-21 DIAGNOSIS — H43813 Vitreous degeneration, bilateral: Secondary | ICD-10-CM | POA: Diagnosis not present

## 2024-01-21 DIAGNOSIS — Z961 Presence of intraocular lens: Secondary | ICD-10-CM | POA: Diagnosis not present

## 2024-02-02 DIAGNOSIS — I10 Essential (primary) hypertension: Secondary | ICD-10-CM | POA: Diagnosis not present

## 2024-02-02 DIAGNOSIS — E785 Hyperlipidemia, unspecified: Secondary | ICD-10-CM | POA: Diagnosis not present

## 2024-02-02 DIAGNOSIS — R002 Palpitations: Secondary | ICD-10-CM | POA: Diagnosis not present

## 2024-02-22 ENCOUNTER — Other Ambulatory Visit: Payer: Self-pay | Admitting: Family Medicine

## 2024-03-31 ENCOUNTER — Other Ambulatory Visit: Payer: Self-pay | Admitting: Family Medicine

## 2024-03-31 DIAGNOSIS — I10 Essential (primary) hypertension: Secondary | ICD-10-CM

## 2024-03-31 DIAGNOSIS — G4700 Insomnia, unspecified: Secondary | ICD-10-CM

## 2024-03-31 NOTE — Telephone Encounter (Signed)
 LOV 11/20/23 NOV 04/27/24 LRF 06/30/23 qty:20 r:1 Alprazolam 

## 2024-04-27 ENCOUNTER — Ambulatory Visit: Admitting: Family Medicine

## 2024-05-06 DIAGNOSIS — L570 Actinic keratosis: Secondary | ICD-10-CM | POA: Diagnosis not present

## 2024-05-06 DIAGNOSIS — L821 Other seborrheic keratosis: Secondary | ICD-10-CM | POA: Diagnosis not present

## 2024-05-06 DIAGNOSIS — B360 Pityriasis versicolor: Secondary | ICD-10-CM | POA: Diagnosis not present

## 2024-05-06 DIAGNOSIS — D225 Melanocytic nevi of trunk: Secondary | ICD-10-CM | POA: Diagnosis not present

## 2024-05-06 DIAGNOSIS — Z85828 Personal history of other malignant neoplasm of skin: Secondary | ICD-10-CM | POA: Diagnosis not present

## 2024-05-06 DIAGNOSIS — Z08 Encounter for follow-up examination after completed treatment for malignant neoplasm: Secondary | ICD-10-CM | POA: Diagnosis not present

## 2024-05-10 NOTE — Progress Notes (Signed)
 Pharmacy Quality Measure Review  This patient is appearing on a report for being at risk of failing the adherence measure for hypertension (ACEi/ARB) medications this calendar year.   Medication: lisinopril /hydrochlorothiazide   Last fill date: 03/31/24 for 90 day supply  Insurance report was not up to date. No action needed at this time.   Kerilyn Cortner E. Marsh, PharmD Clinical Pharmacist [facility] [phone]

## 2024-05-26 ENCOUNTER — Encounter: Payer: Self-pay | Admitting: Family Medicine

## 2024-05-26 ENCOUNTER — Ambulatory Visit: Admitting: Family Medicine

## 2024-05-26 VITALS — BP 123/72 | HR 70 | Temp 98.4°F | Resp 16 | Ht 66.0 in | Wt 136.6 lb

## 2024-05-26 DIAGNOSIS — J301 Allergic rhinitis due to pollen: Secondary | ICD-10-CM

## 2024-05-26 DIAGNOSIS — I10 Essential (primary) hypertension: Secondary | ICD-10-CM

## 2024-05-26 DIAGNOSIS — J45909 Unspecified asthma, uncomplicated: Secondary | ICD-10-CM | POA: Diagnosis not present

## 2024-05-26 DIAGNOSIS — Z23 Encounter for immunization: Secondary | ICD-10-CM

## 2024-05-26 MED ORDER — ALBUTEROL SULFATE HFA 108 (90 BASE) MCG/ACT IN AERS
2.0000 | INHALATION_SPRAY | Freq: Four times a day (QID) | RESPIRATORY_TRACT | 4 refills | Status: AC | PRN
Start: 2024-05-26 — End: ?

## 2024-05-26 MED ORDER — MONTELUKAST SODIUM 10 MG PO TABS: 10.0000 mg | ORAL_TABLET | Freq: Every day | ORAL | 2 refills | Status: AC

## 2024-05-26 NOTE — Progress Notes (Signed)
 Established patient visit   Patient: Michelle Parrish   DOB: 1941-06-22   83 y.o. Female  MRN: [id number] Visit Date: 05/26/2024  Today's healthcare provider: Nancyann Perry, MD   Chief Complaint  Patient presents with   Medical Management of Chronic Issues   Subjective    Discussed the use of AI scribe software for clinical note transcription with the patient, who gave verbal consent to proceed.  History of Present Illness   Michelle Parrish is an 83 year old female who presents for follow up of hypertension.    She is experiencing difficulty sleeping, which she attributes to the recent passing of her husband. Even when her husband was sick, she was not resting well as she was keeping an ear open to hear him. Now, she finds it challenging to adjust to being alone and ensuring she hears nothing in the house. She describes the situation as a 'big adjustment' and notes that she is not afraid, but it is different and the responsibility is now solely on her.  She expresses concern about not having had a 'good cry' despite missing her husband 'like crazy.' She reflects on having grieved during the two years of her husband's illness following his diagnosis, as she was aware of what was coming.  She is managing her blood pressure with medication and monitors it at home. She monitors it at home and is trying to walk about twenty minutes a day to help her rest better.  She requires prescription refills for Singulair  and gabapentin, which she believes Rheumatology prescribes. She also mentions needing a refill for her albuterol  inhaler. She has a Wixela inhaler, which replaced Advair , and is on her last refill.  Her sodium levels were noted to be low in the spring. She has received her flu shot recently and does not plan to have any more COVID shots due to experiencing more severe migraines after receiving them.     Lab Results  Component Value Date   NA 133 (L) 10/28/2023   K 4.4  10/28/2023   CREATININE 0.72 10/28/2023   EGFR 84 10/28/2023   GLUCOSE 84 10/28/2023     Medications: Outpatient Medications Prior to Visit  Medication Sig   ALPRAZolam  (XANAX ) 0.25 MG tablet TAKE 1 TABLET BY MOUTH EVERY 6 HOURS AS NEEDED FOR ANXIETY   amLODipine  (NORVASC ) 5 MG tablet TAKE 1 TABLET BY MOUTH EVERY DAY   EPIPEN  2-PAK 0.3 MG/0.3ML SOAJ injection INJECT INTO THIGH MUSCLE AS NEEDED FOR ANAPHYLAXIS.   fluticasone -salmeterol (WIXELA INHUB) 100-50 MCG/ACT AEPB Inhale 1 puff into the lungs 2 (two) times daily.   gabapentin (NEURONTIN) 100 MG capsule Take 2 capsules by mouth Nightly.   lisinopril -hydrochlorothiazide  (ZESTORETIC ) 20-12.5 MG tablet TAKE 1 TABLET BY MOUTH EVERY DAY   meclizine  (ANTIVERT ) 25 MG tablet Take 1 tablet (25 mg total) by mouth 3 (three) times daily as needed for dizziness.   metoprolol  succinate (TOPROL -XL) 100 MG 24 hr tablet TAKE 1 TABLET BY MOUTH EVERY DAY WITH OR IMMEDIATELY FOLLOWING A MEAL   mometasone (ELOCON) 0.1 % ointment    Multiple Vitamin (MULTIVITAMIN) tablet Take 1 tablet by mouth daily.   SUMAtriptan (IMITREX) 25 MG tablet Take 25 mg by mouth every 2 (two) hours as needed for migraine.   Vitamin D , Ergocalciferol , (DRISDOL ) 1.25 MG (50000 UNIT) CAPS capsule TAKE 1 CAPSULE (50,000 UNITS TOTAL) BY MOUTH EVERY 30 DAYS   albuterol  (VENTOLIN  HFA) 108 (90 Base) MCG/ACT inhaler Inhale 2 puffs  into the lungs every 6 (six) hours as needed for wheezing or shortness of breath. SEASONALLY FOR ASTHMA   alendronate  (FOSAMAX ) 70 MG tablet Take 1 tablet (70 mg total) by mouth every 7 (seven) days. Take with a full glass of water on an empty stomach. Do not lay down, eat or drink for at least 30 minutes after taking medicine.   Misc Natural Products (CYSTEX URINARY HEALTH) LIQD Take 15 mLs by mouth daily.   montelukast  (SINGULAIR ) 10 MG tablet TAKE 1 TABLET BY MOUTH EVERY DAY   No facility-administered medications prior to visit.   Review of Systems      Objective    BP 123/72 (BP Location: Left Arm, Patient Position: Sitting, Cuff Size: Normal)   Pulse 70   Temp 98.4 F (36.9 C) (Oral)   Resp 16   Ht 5' 6 (1.676 m)   Wt 136 lb 9.6 oz (62 kg)   SpO2 100%   BMI 22.05 kg/m   Physical Exam   General appearance: Well developed, well nourished female, cooperative and in no acute distress Head: Normocephalic, without obvious abnormality, atraumatic Respiratory: Respirations even and unlabored, normal respiratory rate Extremities: All extremities are intact.  Skin: Skin color, texture, turgor normal. No rashes seen  Psych: Appropriate mood and affect. Neurologic: Mental status: Alert, oriented to person, place, and time, thought content appropriate.    Assessment & Plan    1. Primary hypertension (Primary)  - Renal function panel  2. 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  Dispense: 90 tablet; Refill: 2 - albuterol  (VENTOLIN  HFA) 108 (90 Base) MCG/ACT inhaler; Inhale 2 puffs into the lungs every 6 (six) hours as needed for wheezing or shortness of breath. SEASONALLY FOR ASTHMA  Dispense: 18 each; Refill: 4  3. Allergic rhinitis due to pollen, unspecified seasonality refill - montelukast  (SINGULAIR ) 10 MG tablet; Take 1 tablet (10 mg total) by mouth daily.  Dispense: 90 tablet; Refill: 2  4. Need for influenza vaccination  - Flu vaccine HIGH DOSE PF(Fluzone Trivalent)       Nancyann Perry, MD  [facility] [phone] (phone) [phone] (fax)  [facility]

## 2024-05-26 NOTE — Patient Instructions (Signed)
 SABRA  Please review the attached list of medications and notify my office if there are any errors.   . Please bring all of your medications to every appointment so we can make sure that our medication list is the same as yours.

## 2024-05-27 LAB — RENAL FUNCTION PANEL
Albumin: 4.4 g/dL (ref 3.7–4.7)
BUN/Creatinine Ratio: 9 — ABNORMAL LOW (ref 12–28)
BUN: 7 mg/dL — ABNORMAL LOW (ref 8–27)
CO2: 23 mmol/L (ref 20–29)
Calcium: 9.8 mg/dL (ref 8.7–10.3)
Chloride: 93 mmol/L — ABNORMAL LOW (ref 96–106)
Creatinine, Ser: 0.75 mg/dL (ref 0.57–1.00)
Glucose: 81 mg/dL (ref 70–99)
Phosphorus: 3.9 mg/dL (ref 3.0–4.3)
Potassium: 4.4 mmol/L (ref 3.5–5.2)
Sodium: 131 mmol/L — ABNORMAL LOW (ref 134–144)
eGFR: 80 mL/min/1.73

## 2024-05-28 ENCOUNTER — Encounter: Payer: Self-pay | Admitting: Family Medicine

## 2024-05-28 NOTE — Telephone Encounter (Signed)
Messaged patient about results.

## 2024-05-30 ENCOUNTER — Ambulatory Visit: Payer: Self-pay | Admitting: Family Medicine

## 2024-06-08 DIAGNOSIS — M47816 Spondylosis without myelopathy or radiculopathy, lumbar region: Secondary | ICD-10-CM | POA: Diagnosis not present

## 2024-06-08 DIAGNOSIS — M150 Primary generalized (osteo)arthritis: Secondary | ICD-10-CM | POA: Diagnosis not present

## 2024-06-08 DIAGNOSIS — M791 Myalgia, unspecified site: Secondary | ICD-10-CM | POA: Diagnosis not present

## 2024-06-08 DIAGNOSIS — L409 Psoriasis, unspecified: Secondary | ICD-10-CM | POA: Diagnosis not present

## 2024-06-21 ENCOUNTER — Other Ambulatory Visit: Payer: Self-pay | Admitting: Family Medicine

## 2024-06-21 DIAGNOSIS — Z1231 Encounter for screening mammogram for malignant neoplasm of breast: Secondary | ICD-10-CM

## 2024-07-27 ENCOUNTER — Encounter

## 2024-07-29 ENCOUNTER — Inpatient Hospital Stay: Admission: RE | Admit: 2024-07-29 | Discharge: 2024-07-29 | Attending: Family Medicine | Admitting: Family Medicine

## 2024-07-29 DIAGNOSIS — Z1231 Encounter for screening mammogram for malignant neoplasm of breast: Secondary | ICD-10-CM | POA: Diagnosis present

## 2024-09-01 ENCOUNTER — Other Ambulatory Visit: Payer: Self-pay | Admitting: Family Medicine
